# Patient Record
Sex: Female | Born: 1980 | Race: Black or African American | Hispanic: No | Marital: Single | Smoking: Current every day smoker
Health system: Southern US, Community
[De-identification: ages and names within clinical notes are randomized; demographics above are authoritative.]

## PROBLEM LIST (undated history)

## (undated) ENCOUNTER — Inpatient Hospital Stay (HOSPITAL_COMMUNITY): Payer: Self-pay

## (undated) DIAGNOSIS — I10 Essential (primary) hypertension: Secondary | ICD-10-CM

---

## 2002-11-10 ENCOUNTER — Emergency Department (HOSPITAL_COMMUNITY): Admission: EM | Admit: 2002-11-10 | Discharge: 2002-11-10 | Payer: Self-pay | Admitting: Emergency Medicine

## 2004-06-25 ENCOUNTER — Emergency Department: Payer: Self-pay | Admitting: Internal Medicine

## 2004-10-24 ENCOUNTER — Ambulatory Visit (HOSPITAL_COMMUNITY): Admission: RE | Admit: 2004-10-24 | Discharge: 2004-10-24 | Payer: Self-pay

## 2006-03-22 ENCOUNTER — Emergency Department: Payer: Self-pay | Admitting: Emergency Medicine

## 2006-05-21 ENCOUNTER — Emergency Department: Payer: Self-pay | Admitting: Emergency Medicine

## 2006-05-21 ENCOUNTER — Emergency Department (HOSPITAL_COMMUNITY): Admission: EM | Admit: 2006-05-21 | Discharge: 2006-05-21 | Payer: Self-pay | Admitting: Emergency Medicine

## 2006-08-12 ENCOUNTER — Emergency Department: Payer: Self-pay | Admitting: General Practice

## 2006-08-17 ENCOUNTER — Emergency Department: Payer: Self-pay | Admitting: Emergency Medicine

## 2006-09-04 ENCOUNTER — Emergency Department: Payer: Self-pay

## 2007-01-22 ENCOUNTER — Emergency Department: Payer: Self-pay

## 2007-02-06 ENCOUNTER — Emergency Department: Payer: Self-pay | Admitting: Emergency Medicine

## 2009-08-20 ENCOUNTER — Emergency Department: Payer: Self-pay

## 2011-05-01 ENCOUNTER — Emergency Department: Payer: Self-pay | Admitting: Internal Medicine

## 2011-05-01 LAB — CBC WITH DIFFERENTIAL/PLATELET
Basophil %: 0.3 %
Eosinophil #: 0 10*3/uL (ref 0.0–0.7)
HGB: 14.6 g/dL (ref 12.0–16.0)
Lymphocyte #: 1.6 10*3/uL (ref 1.0–3.6)
MCH: 31.3 pg (ref 26.0–34.0)
MCHC: 33.7 g/dL (ref 32.0–36.0)
MCV: 93 fL (ref 80–100)
Monocyte #: 0.3 10*3/uL (ref 0.0–0.7)
Neutrophil #: 4.6 10*3/uL (ref 1.4–6.5)

## 2011-05-01 LAB — URINALYSIS, COMPLETE
Blood: NEGATIVE
Glucose,UR: NEGATIVE mg/dL (ref 0–75)
Nitrite: NEGATIVE
Ph: 9 (ref 4.5–8.0)

## 2011-05-01 LAB — COMPREHENSIVE METABOLIC PANEL
Anion Gap: 12 (ref 7–16)
Bilirubin,Total: 0.4 mg/dL (ref 0.2–1.0)
Calcium, Total: 9.2 mg/dL (ref 8.5–10.1)
Chloride: 105 mmol/L (ref 98–107)
Co2: 25 mmol/L (ref 21–32)
EGFR (Non-African Amer.): >60
Glucose: 99 mg/dL (ref 65–99)
Osmolality: 283 (ref 275–301)
Potassium: 4.3 mmol/L (ref 3.5–5.1)
SGOT(AST): 29 U/L (ref 15–37)
Sodium: 142 mmol/L (ref 136–145)

## 2011-05-01 LAB — PREGNANCY, URINE: Pregnancy Test, Urine: NEGATIVE m[IU]/mL

## 2011-05-01 LAB — LIPASE, BLOOD: Lipase: 73 U/L (ref 73–393)

## 2011-10-18 ENCOUNTER — Emergency Department: Payer: Self-pay | Admitting: Emergency Medicine

## 2012-04-24 ENCOUNTER — Emergency Department: Payer: Self-pay | Admitting: Emergency Medicine

## 2012-04-24 LAB — COMPREHENSIVE METABOLIC PANEL
Albumin: 3.6 g/dL (ref 3.4–5.0)
Anion Gap: 7 (ref 7–16)
BUN: 9 mg/dL (ref 7–18)
Calcium, Total: 8.6 mg/dL (ref 8.5–10.1)
Glucose: 87 mg/dL (ref 65–99)
Osmolality: 277 (ref 275–301)
Potassium: 3.1 mmol/L — ABNORMAL LOW (ref 3.5–5.1)
Sodium: 140 mmol/L (ref 136–145)
Total Protein: 7.7 g/dL (ref 6.4–8.2)

## 2012-11-01 ENCOUNTER — Emergency Department: Payer: Self-pay | Admitting: Emergency Medicine

## 2012-11-01 LAB — URINALYSIS, COMPLETE: Glucose,UR: NEGATIVE mg/dL (ref 0–75)

## 2012-11-01 LAB — CBC
HGB: 12.7 g/dL (ref 12.0–16.0)
MCH: 30.5 pg (ref 26.0–34.0)
Platelet: 295 10*3/uL (ref 150–440)
RBC: 4.17 10*6/uL (ref 3.80–5.20)
RDW: 13.1 % (ref 11.5–14.5)

## 2012-11-07 ENCOUNTER — Emergency Department: Payer: Self-pay | Admitting: Emergency Medicine

## 2012-11-07 LAB — CBC
HCT: 34.8 % — ABNORMAL LOW (ref 35.0–47.0)
MCH: 30.8 pg (ref 26.0–34.0)
MCV: 90 fL (ref 80–100)
RBC: 3.87 10*6/uL (ref 3.80–5.20)
WBC: 9.6 10*3/uL (ref 3.6–11.0)

## 2012-11-08 LAB — URINALYSIS, COMPLETE
Bilirubin,UR: NEGATIVE
Ketone: NEGATIVE
Specific Gravity: 1.027 (ref 1.003–1.030)
Squamous Epithelial: 4
WBC UR: 12 /HPF (ref 0–5)

## 2012-11-08 LAB — HCG, QUANTITATIVE, PREGNANCY: Beta Hcg, Quant.: 35714 m[IU]/mL — ABNORMAL HIGH

## 2013-03-16 ENCOUNTER — Emergency Department: Payer: Self-pay | Admitting: Emergency Medicine

## 2015-06-06 ENCOUNTER — Encounter (HOSPITAL_COMMUNITY): Payer: Self-pay | Admitting: Emergency Medicine

## 2015-06-06 ENCOUNTER — Emergency Department (HOSPITAL_COMMUNITY)
Admission: EM | Admit: 2015-06-06 | Discharge: 2015-06-06 | Disposition: A | Discharge status: Home / Self Care | Payer: Self-pay | Attending: Emergency Medicine | Admitting: Emergency Medicine

## 2015-06-06 DIAGNOSIS — I10 Essential (primary) hypertension: Secondary | ICD-10-CM | POA: Insufficient documentation

## 2015-06-06 DIAGNOSIS — H1133 Conjunctival hemorrhage, bilateral: Secondary | ICD-10-CM | POA: Insufficient documentation

## 2015-06-06 DIAGNOSIS — H109 Unspecified conjunctivitis: Secondary | ICD-10-CM | POA: Insufficient documentation

## 2015-06-06 HISTORY — DX: Essential (primary) hypertension: I10

## 2015-06-06 MED ORDER — TOBRAMYCIN 0.3 % OP SOLN
2.0000 [drp] | OPHTHALMIC | Status: DC
  Administered 2015-06-06: 2 [drp] via OPHTHALMIC
  Filled 2015-06-06: qty 5

## 2015-06-06 NOTE — ED Provider Notes (Signed)
CSN: [id number]     Arrival date & time 06/06/15  4098 History   First MD Initiated Contact with Patient 06/06/15 [phone]     Chief Complaint  Patient presents with  . Eye Problem     HPI Patient ports of bilateral pruritus of her eyes.  She is tried over-the-counter antiallergy medications without improvement in her symptoms.  She woke this morning with significant irritation and redness of her bilateral conjunctiva consistent with conjunctival hemorrhage.  She states she was rubbing her eyes a lot last night.  She does not have a primary care physician.  She denies fevers and chills.  No blurred vision or changes in her vision.  She denies headache.  Does report some nasal congestion over the past week.  Symptoms are mild in severity.   Past Medical History  Diagnosis Date  . Hypertension    History reviewed. No pertinent past surgical history. History reviewed. No pertinent family history. Social History  Substance Use Topics  . Smoking status: None  . Smokeless tobacco: None  . Alcohol Use: None   OB History    No data available     Review of Systems  All other systems reviewed and are negative.     Allergies  Review of patient's allergies indicates no known allergies.  Home Medications  None  BP 165/103 mmHg  Pulse 91  Temp(Src) 98.7 F (37.1 C) (Oral)  Resp 16  SpO2 100% Physical Exam  Constitutional: She is oriented to person, place, and time. She appears well-developed and well-nourished. No distress.  HENT:  Head: Normocephalic and atraumatic.  Eyes: EOM are normal. Pupils are equal, round, and reactive to light. Right eye exhibits chemosis. Right eye exhibits no discharge. Left eye exhibits chemosis. Left eye exhibits no discharge. Right conjunctiva is injected. Right conjunctiva has a hemorrhage. Left conjunctiva is injected. Left conjunctiva has a hemorrhage.  Neck: Normal range of motion.  Cardiovascular: Normal rate.   Pulmonary/Chest: Effort normal.   Musculoskeletal: Normal range of motion.  Neurological: She is alert and oriented to person, place, and time.  Skin: Skin is warm and dry.  Psychiatric: She has a normal mood and affect. Judgment normal.  Nursing note and vitals reviewed.   ED Course  Procedures (including critical care time) Labs Review Labs Reviewed - No data to display  Imaging Review No results found. I have personally reviewed and evaluated these images and lab results as part of my medical decision-making.   EKG Interpretation None      MDM   Final diagnoses:  Bilateral conjunctivitis  Subconjunctival bleed, bilateral    Overall the patient is well-appearing.  She'll be started on antibiotic drops.  Much of her injection appears to be some conjunctival hemorrhage.  If she does not improve she'll need to seek optometry or ophthalmology.  She's been given the number for the ophthalmologist.  She understands to return to the emergency department for new or worsening symptoms.    Azalia Bilis, MD 06/06/15 [phone]

## 2015-06-06 NOTE — ED Notes (Signed)
Awake. Verbally responsive. A/O x4. Resp even and unlabored. No audible adventitious breath sounds noted. ABC's intact.  

## 2015-06-06 NOTE — ED Notes (Addendum)
Recently exposed to pink eye. Over the last week has been trying to use allergy medication to manage symptoms. C/O bilateral irritated sclera, watery, itching eyes. Hypertensive in triage. Hx of hypertension, states she can't afford her BP meds

## 2015-06-06 NOTE — Discharge Instructions (Signed)
Bacterial Conjunctivitis Bacterial conjunctivitis, commonly called pink eye, is an inflammation of the clear membrane that covers the white part of the eye (conjunctiva). The inflammation can also happen on the underside of the eyelids. The blood vessels in the conjunctiva become inflamed, causing the eye to become red or pink. Bacterial conjunctivitis may spread easily from one eye to another and from person to person (contagious).  CAUSES  Bacterial conjunctivitis is caused by bacteria. The bacteria may come from your own skin, your upper respiratory tract, or from someone else with bacterial conjunctivitis. SYMPTOMS  The normally white color of the eye or the underside of the eyelid is usually pink or red. The pink eye is usually associated with irritation, tearing, and some sensitivity to light. Bacterial conjunctivitis is often associated with a thick, yellowish discharge from the eye. The discharge may turn into a crust on the eyelids overnight, which causes your eyelids to stick together. If a discharge is present, there may also be some blurred vision in the affected eye. DIAGNOSIS  Bacterial conjunctivitis is diagnosed by your caregiver through an eye exam and the symptoms that you report. Your caregiver looks for changes in the surface tissues of your eyes, which may point to the specific type of conjunctivitis. A sample of any discharge may be collected on a cotton-tip swab if you have a severe case of conjunctivitis, if your cornea is affected, or if you keep getting repeat infections that do not respond to treatment. The sample will be sent to a lab to see if the inflammation is caused by a bacterial infection and to see if the infection will respond to antibiotic medicines. TREATMENT   Bacterial conjunctivitis is treated with antibiotics. Antibiotic eyedrops are most often used. However, antibiotic ointments are also available. Antibiotics pills are sometimes used. Artificial tears or eye  washes may ease discomfort. HOME CARE INSTRUCTIONS   To ease discomfort, apply a cool, clean washcloth to your eye for 10-20 minutes, 3-4 times a day.  Gently wipe away any drainage from your eye with a warm, wet washcloth or a cotton ball.  Wash your hands often with soap and water. Use paper towels to dry your hands.  Do not share towels or washcloths. This may spread the infection.  Change or wash your pillowcase every day.  You should not use eye makeup until the infection is gone.  Do not operate machinery or drive if your vision is blurred.  Stop using contact lenses. Ask your caregiver how to sterilize or replace your contacts before using them again. This depends on the type of contact lenses that you use.  When applying medicine to the infected eye, do not touch the edge of your eyelid with the eyedrop bottle or ointment tube. SEEK IMMEDIATE MEDICAL CARE IF:   Your infection has not improved within 3 days after beginning treatment.  You had yellow discharge from your eye and it returns.  You have increased eye pain.  Your eye redness is spreading.  Your vision becomes blurred.  You have a fever or persistent symptoms for more than 2-3 days.  You have a fever and your symptoms suddenly get worse.  You have facial pain, redness, or swelling. MAKE SURE YOU:   Understand these instructions.  Will watch your condition.  Will get help right away if you are not doing well or get worse.   This information is not intended to replace advice given to you by your health care provider. Make sure you   discuss any questions you have with your health care provider.   Document Released: 04/16/2005 Document Revised: 05/07/2014 Document Reviewed: 09/17/2011 Elsevier Interactive Patient Education 2016 Elsevier Inc.  Subconjunctival Hemorrhage Subconjunctival hemorrhage is bleeding that happens between the white part of your eye (sclera) and the clear membrane that covers the  outside of your eye (conjunctiva). There are many tiny blood vessels near the surface of your eye. A subconjunctival hemorrhage happens when one or more of these vessels breaks and bleeds, causing a red patch to appear on your eye. This is similar to a bruise. Depending on the amount of bleeding, the red patch may only cover a small area of your eye or it may cover the entire visible part of the sclera. If a lot of blood collects under the conjunctiva, there may also be swelling. Subconjunctival hemorrhages do not affect your vision or cause pain, but your eye may feel irritated if there is swelling. Subconjunctival hemorrhages usually do not require treatment, and they disappear on their own within two weeks. CAUSES This condition may be caused by:  Mild trauma, such as rubbing your eye too hard.  Severe trauma or blunt injuries.  Coughing, sneezing, or vomiting.  Straining, such as when lifting a heavy object.  High blood pressure.  Recent eye surgery.  A history of diabetes.  Certain medicines, especially blood thinners (anticoagulants).  Other conditions, such as eye tumors, bleeding disorders, or blood vessel abnormalities. Subconjunctival hemorrhages can happen without an obvious cause.  SYMPTOMS  Symptoms of this condition include:  A bright red or dark red patch on the white part of the eye.  The red area may spread out to cover a larger area of the eye before it goes away.  The red area may turn brownish-yellow before it goes away.  Swelling.  Mild eye irritation. DIAGNOSIS This condition is diagnosed with a physical exam. If your subconjunctival hemorrhage was caused by trauma, your health care provider may refer you to an eye specialist (ophthalmologist) or another specialist to check for other injuries. You may have other tests, including:  An eye exam.  A blood pressure check.  Blood tests to check for bleeding disorders. If your subconjunctival hemorrhage was  caused by trauma, X-rays or a CT scan may be done to check for other injuries. TREATMENT Usually, no treatment is needed. Your health care provider may recommend eye drops or cold compresses to help with discomfort. HOME CARE INSTRUCTIONS  Take over-the-counter and prescription medicines only as directed by your health care provider.  Use eye drops or cold compresses to help with discomfort as directed by your health care provider.  Avoid activities, things, and environments that may irritate or injure your eye.  Keep all follow-up visits as told by your health care provider. This is important. SEEK MEDICAL CARE IF:  You have pain in your eye.  The bleeding does not go away within 3 weeks.  You keep getting new subconjunctival hemorrhages. SEEK IMMEDIATE MEDICAL CARE IF:  Your vision changes or you have difficulty seeing.  You suddenly develop severe sensitivity to light.  You develop a severe headache, persistent vomiting, confusion, or abnormal tiredness (lethargy).  Your eye seems to bulge or protrude from your eye socket.  You develop unexplained bruises on your body.  You have unexplained bleeding in another area of your body.   This information is not intended to replace advice given to you by your health care provider. Make sure you discuss any questions you  have with your health care provider.   Document Released: 04/16/2005 Document Revised: 01/05/2015 Document Reviewed: 06/23/2014 Elsevier Interactive Patient Education 2016 [employer].    Emergency Department Resource Guide 1) Find a Doctor and Pay Out of Pocket Although you won't have to find out who is covered by your insurance plan, it is a good idea to ask around and get recommendations. You will then need to call the office and see if the doctor you have chosen will accept you as a new patient and what types of options they offer for patients who are self-pay. Some doctors offer discounts or will set up  payment plans for their patients who do not have insurance, but you will need to ask so you aren't surprised when you get to your appointment.  2) Contact Your Local Health Department Not all health departments have doctors that can see patients for sick visits, but many do, so it is worth a call to see if yours does. If you don't know where your local health department is, you can check in your phone book. The CDC also has a tool to help you locate your state's health department, and many state websites also have listings of all of their local health departments.  3) Find a Walk-in Clinic If your illness is not likely to be very severe or complicated, you may want to try a walk in clinic. These are popping up all over the country in pharmacies, drugstores, and shopping centers. They're usually staffed by nurse practitioners or physician assistants that have been trained to treat common illnesses and complaints. They're usually fairly quick and inexpensive. However, if you have serious medical issues or chronic medical problems, these are probably not your best option.  No Primary Care Doctor: - Call Health Connect at  [phone] - they can help you locate a primary care doctor that  accepts your insurance, provides certain services, etc. - Physician Referral Service- [phone]  Chronic Pain Problems: Organization         Address  Phone   Notes  [facility]  [phone] Patients need to be referred by their primary care doctor.   Medication Assistance: Organization         Address  Phone   Notes  [facility] Med[facility] [address] [place] [place], [place] [address] [phone] --Must be a resident of [facility] -- Must have NO insurance coverage whatsoever (no Medicaid/ Medicare, etc.) -- The pt. MUST have a primary care doctor that directs their care regularly and follows them in the community   [facility]  [phone]   [employer]  [phone]    Agencies that provide inexpensive medical care: Organization         Address  Phone   Notes  [facility]  [phone]   [facility]    [phone]   [facility] [address] [place], [place] [address] [phone]   [facility] [address] [place] [place] [phone]   Planned Parenthood    [phone]   [facility]    [phone]   [facility]  [address], [place] Phone:  [phone], Fax:  [phone] Hours of Operation:  9 am - 6 pm, M-F.  Also accepts Medicaid/Medicare and self-pay.  [facility]  [address],  [place] Phone: [phone], Fax: [phone]. Hours of Operation:  8:30 am - 5:30 pm, M-F.  Also accepts Medicaid and self-pay.  [facility] [place] [address], [place] Phone: [phone]   [facility] [address] Natasha Bence [place], [place] [phone], Ext. [phone] Mondays & Thursdays: 7-9 AM.  First 15 patients are seen on a first come, first serve basis.    Medicaid-accepting [facility] Providers:  Organization         Address  Phone   Notes  [facility] [address], [place] [phone] Also accepts self-pay patients.  [facility] [address] Laurell Josephs [place], [place]  [phone]   [facility] [address], [place] [phone]   [facility] [address], [place] [phone]   Renaye Rakers [address], [place]   [phone] Only accepts [place] Access [place] patients after they have their name applied to their card.   Self-Pay (no insurance) in [facility]:  Organization         Address  Phone   Notes  Sickle Cell Patients, [facility] [address] [place], [place]  [phone]   [facility] [address] [place], [place] [phone]   [facility] [place]  [address], [place] [phone]   [facility]/Dr. Osei-Bonsu  [address], [place] or [address], [place] [phone] Phone number for both [place] and [place] locations is the same.  [facility] and [facility] [address] [place] [phone]   [facility] [address], [place] or [address] [phone]   [facility] [address], [place] [phone], phone; [phone], fax Sees patients 1st and 3rd Saturday of every month.  Must not qualify for public or private insurance (i.e. Medicaid, Medicare, [place] Health Choice, Veterans' Benefits)  Household income should be no more than 200% of the poverty level The clinic cannot treat you if you are pregnant or think you are pregnant  Sexually transmitted diseases are not treated at the clinic.    Dental Care: Organization         Address  Phone  Notes  [facility] [address] [place], [place] [phone] Accepts children up to age 90 who are enrolled in [place] or [place] Health Choice; pregnant women with a Medicaid card; and children who have applied for Medicaid or [place] Health Choice, but were declined, whose parents can pay a reduced fee at time of service.  [facility]  [address], [place] [phone] Accepts children up to age 40 who are enrolled in [place] or [place] Health Choice; pregnant women with a Medicaid card; and children who have applied for Medicaid or [place] Health Choice, but were declined, whose parents can pay a reduced fee at time of service.  [facility]  [address] [place], [place] [phone] Patients  are seen by appointment only. Walk-ins are not accepted. [facility] will see patients 30 years of age and older. Monday - Tuesday (8am-5pm) Most Wednesdays (8:30-5pm) $30 per visit, cash only  [employer] [facility] [employer] [facility] PRO[facility]  [address], [facility] [phone] Patients are seen by appointment only. Walk-ins are not accepted. [facility] will see patients 85 years of age and older. One Wednesday Evening (Monthly: Volunteer Based).  $30 per visit, cash only  [employer]  [phone] for adults; Children under age 52, call Graduate Pediatric Dentistry at [phone]. Children aged 65-14, please call [phone] to request a pediatric application.  Dental services are provided in all areas of dental care including fillings, crowns and bridges, complete and partial dentures, implants, gum treatment, root canals, and extractions. Preventive care is also provided. Treatment is provided to both adults and children. Patients are selected via a lottery and there is often a waiting list.   [facility] [address], [place]  [phone] [email or web address]   [facility] [address] [place], [place] [phone], Ext. [phone] Second and Fourth Thursday of each month, opens at 6:30 AM; Clinic ends at 9 AM.  Patients are seen on a first-come first-served basis, and a limited number are seen during each clinic.   [facility]  [address] Ether Griffins [place], [place] [phone]   Eligibility Requirements You must have lived in [place], [place], or [place] counties for at least the last three months.   You cannot be eligible for state or federal sponsored [place], including [employer], [place], or [employer].   You generally cannot be eligible for healthcare insurance through your employer.    How to apply: Eligibility screenings are held every Tuesday and Wednesday afternoon from 1:00 pm until  4:00 pm. You do not need an appointment for the interview!  [facility] [address] [place], [place] [phone]   [facility] Health Department  [phone]   [facility] Health Department  [phone]   [facility] Health Department  [phone]    Behavioral Health Resources in the Community: Intensive Outpatient Programs Organization         Address  Phone  Notes  [facility] Services [address] [place], [place] [phone]   [facility] Outpatient [address], [place], [place] [phone]   [facility] [address], [place], [place]  [phone]   [facility] Mental Health [address]  [place], [place] [phone] or [phone]   Substance Abuse Resources Organization         Address  Phone  Notes  [facility]  [phone]   [facility]  [phone]   [facility]  [phone]   Floydene Flock  [phone]   Residential & Outpatient Substance Abuse Program  [phone]   Psychological Services Organization         Address  Phone  Notes  [facility] Health  [phone]   [facility] Services  [phone]   [facility] Mental Health [address] [place] [phone] or [phone]    Mobile Crisis Teams Organization         Address  Phone  Notes  [facility], Mobile Crisis Care Unit  [phone]   [facility]  [address] [place], [place] [phone]   Doristine Locks [address] [place] [phone]    Self-Help/Support Groups Organization         Address  Phone             Notes  [facility] [place] - variety of  support groups  336- ([phone] Call for more information  Narcotics Anonymous (NA), [facility] [address], [employer] [place]  2 meetings at this location   Residential Sports administrator          Address  Phone  Notes  [facility] [address],    [place]  [phone]   [facility]  [address] [place] [address], [place], [place] [phone]   [facility] [address] [place], [place] [phone] Admissions: 8am-3pm M-F  [facility] [address]    [place], [place] [phone]   [facility] [address] [place], [place], [place] [phone]   [facility] [address]  [place], [place] [phone]   [facility] - Intensive Outpatient [address]ure[address] Josephs [address], [place], [place] [phone]   [facility] ([facility] [address] [place].,  [place], [place] [phone] or [phone]   [facility] ([facility]) [address], [place], [place] [phone] Accepts Medicaid  [facility] [place] [address],  [place] [phone] Substance Abuse/Addiction Treatment   [facility] Organization         Address  Phone  Notes  [facility]  [phone]   Angie Fava, PhD [address] Ervin Kn[place], [place]   [phone] or [phone]   [facility]   [address] [place], [place] [phone]   [facility] [address] [place], [place] [phone] Insurance/Medicaid/sponsorship through [facility] and Families [address]                                    [place], [place] [phone] Therapy/tele-psych/case  [facility] [address], [place] [phone]    Dr. Lolly Mustache  [phone]   [facility]  [employer] [facility] Dept. 1) [address], [place] 2) [address] [place] 3)  [address], [place] [phone]  [phone]   [facility] Child Abuse Hotline 212-776-2929 or [phone] (After Hours)

## 2015-06-18 ENCOUNTER — Emergency Department (HOSPITAL_COMMUNITY)
Admission: EM | Admit: 2015-06-18 | Discharge: 2015-06-18 | Disposition: A | Discharge status: Home / Self Care | Payer: Self-pay | Attending: Emergency Medicine | Admitting: Emergency Medicine

## 2015-06-18 ENCOUNTER — Encounter (HOSPITAL_COMMUNITY): Payer: Self-pay | Admitting: Emergency Medicine

## 2015-06-18 DIAGNOSIS — I10 Essential (primary) hypertension: Secondary | ICD-10-CM | POA: Insufficient documentation

## 2015-06-18 DIAGNOSIS — H61891 Other specified disorders of right external ear: Secondary | ICD-10-CM | POA: Insufficient documentation

## 2015-06-18 DIAGNOSIS — F17200 Nicotine dependence, unspecified, uncomplicated: Secondary | ICD-10-CM | POA: Insufficient documentation

## 2015-06-18 DIAGNOSIS — H6191 Disorder of right external ear, unspecified: Secondary | ICD-10-CM

## 2015-06-18 DIAGNOSIS — B354 Tinea corporis: Secondary | ICD-10-CM | POA: Insufficient documentation

## 2015-06-18 MED ORDER — NYSTATIN-TRIAMCINOLONE 100000-0.1 UNIT/GM-% EX OINT: 1.0000 "application " | TOPICAL_OINTMENT | Freq: Two times a day (BID) | CUTANEOUS | Status: DC

## 2015-06-18 NOTE — ED Notes (Signed)
Pt reports right earache x 1 week, denies fever nor flu like symptoms. Alert and oriented x 4, no obvious distress

## 2015-06-18 NOTE — ED Provider Notes (Signed)
CSN: [id number]     Arrival date & time 06/18/15  1837 History  By signing my name below, I, Emmanuella Mensah, attest that this documentation has been prepared under the direction and in the presence of Karolina Zamor, PA-C . Electronically Signed: Angelene Giovanni, ED Scribe. 06/18/2015. 7:14 PM.  Chief Complaint  Patient presents with  . Otalgia   The history is provided by the patient. No language interpreter was used.   HPI Comments: Debra Obrien is a 35 y.o. female who presents to the Emergency Department complaining of gradually worsening itchy wound around right ear with clear drainage onset 6 days ago. She states that she wears the same headset for approx. 12-16 hours a day at work and her ear sweats a lot throughout the day. She states that she has been using Blue star ointment with soothing aloe on her ear with no relief. She denies wearing any new jewelry or new headsets. No fever, chills, ear pain, or any hearing loss.    Past Medical History  Diagnosis Date  . Hypertension    History reviewed. No pertinent past surgical history. No family history on file. Social History  Substance Use Topics  . Smoking status: Current Every Day Smoker  . Smokeless tobacco: None  . Alcohol Use: None   OB History    No data available     Review of Systems  Constitutional: Negative for fever and chills.  HENT: Positive for ear discharge (discharge from a wound around the ear). Negative for ear pain and hearing loss.   Skin: Positive for wound (around right ear).      Allergies  Review of patient's allergies indicates no known allergies.  Home Medications   Prior to Admission medications   Not on File   BP 169/98 mmHg  Pulse 87  Temp(Src) 98 F (36.7 C) (Oral)  Resp 18  SpO2 95%  LMP 06/18/2015 Physical Exam  Constitutional: She is oriented to person, place, and time. She appears well-developed and well-nourished. No distress.  HENT:  Head: Normocephalic and  atraumatic.  TMs normal bilaterally, normal ear canal bilaterally. See skin exam.  Eyes: Conjunctivae and EOM are normal.  Neck: Neck supple. No tracheal deviation present.  Cardiovascular: Normal rate.   Pulmonary/Chest: Effort normal. No respiratory distress.  Musculoskeletal: Normal range of motion.  Neurological: She is alert and oriented to person, place, and time.  Skin: Skin is warm and dry.  Lesion to the right earlobe that is erythematous, sharply demarcated, with clear drainage and crusting. It is nontender. No cellulitic changes. Duration. It's itchy.  Psychiatric: She has a normal mood and affect. Her behavior is normal.  Nursing note and vitals reviewed.   ED Course  Procedures (including critical care time) DIAGNOSTIC STUDIES: Oxygen Saturation is 95% on RA, adequate by my interpretation.    COORDINATION OF CARE: 7:13 PM- Pt advised of plan for treatment and pt agrees. Pt will receive anti-fungal cream.   MDM   Final diagnoses:  Lesion of earlobe, right  Tinea corporis     patient with right ear lesion, differential includes tinea, allergic reaction, cancerous lesion. Will try on antifungal cream with steroid. I do not think this is cellulitis, lesion is nontender, not warm to the touch. She has not had any injuries to the ear. Patient states that her ear is moist during the day because of the type of ear set that she uses at work. Instructed her to switch to a different air sat. Instructed  to return if not improving.  Filed Vitals:   06/18/15 1843  BP: 169/98  Pulse: 87  Temp: 98 F (36.7 C)  TempSrc: Oral  Resp: 18  SpO2: 95%     Jaynie Crumble, PA-C 06/18/15 1923  Raeford Razor, MD 06/18/15 1927

## 2015-06-18 NOTE — Discharge Instructions (Signed)
Apply ointment twice a day to the earlobe. Keep lesion dry. If not improving or getting worse in nature to follow-up. Use different ear set.    Body Ringworm Ringworm (tinea corporis) is a fungal infection of the skin on the body. This infection is not caused by worms, but is actually caused by a fungus. Fungus normally lives on the top of your skin and can be useful. However, in the case of ringworms, the fungus grows out of control and causes a skin infection. It can involve any area of skin on the body and can spread easily from one person to another (contagious). Ringworm is a common problem for children, but it can affect adults as well. Ringworm is also often found in athletes, especially wrestlers who share equipment and mats.  CAUSES  Ringworm of the body is caused by a fungus called dermatophyte. It can spread by:  Touchingother people who are infected.  Touchinginfected pets.  Touching or sharingobjects that have been in contact with the infected person or pet (hats, combs, towels, clothing, sports equipment). SYMPTOMS   Itchy, raised red spots and bumps on the skin.  Ring-shaped rash.  Redness near the border of the rash with a clear center.  Dry and scaly skin on or around the rash. Not every person develops a ring-shaped rash. Some develop only the red, scaly patches. DIAGNOSIS  Most often, ringworm can be diagnosed by performing a skin exam. Your caregiver may choose to take a skin scraping from the affected area. The sample will be examined under the microscope to see if the fungus is present.  TREATMENT  Body ringworm may be treated with a topical antifungal cream or ointment. Sometimes, an antifungal shampoo that can be used on your body is prescribed. You may be prescribed antifungal medicines to take by mouth if your ringworm is severe, keeps coming back, or lasts a long time.  HOME CARE INSTRUCTIONS   Only take over-the-counter or prescription medicines as directed  by your caregiver.  Wash the infected area and dry it completely before applying yourcream or ointment.  When using antifungal shampoo to treat the ringworm, leave the shampoo on the body for 3-5 minutes before rinsing.   Wear loose clothing to stop clothes from rubbing and irritating the rash.  Wash or change your bed sheets every night while you have the rash.  Have your pet treated by your veterinarian if it has the same infection. To prevent ringworm:   Practice good hygiene.  Wear sandals or shoes in public places and showers.  Do not share personal items with others.  Avoid touching red patches of skin on other people.  Avoid touching pets that have bald spots or wash your hands after doing so. SEEK MEDICAL CARE IF:   Your rash continues to spread after 7 days of treatment.  Your rash is not gone in 4 weeks.  The area around your rash becomes red, warm, tender, and swollen.   This information is not intended to replace advice given to you by your health care provider. Make sure you discuss any questions you have with your health care provider.   Document Released: 04/13/2000 Document Revised: 01/09/2012 Document Reviewed: 10/29/2011 Elsevier Interactive Patient Education [employer]

## 2015-06-19 NOTE — Care Management Note (Signed)
Case Management Note  Patient Details  Name: Debra Obrien MRN: [id number] Date of Birth: [birth year]  Subjective/Objective:     Infection around ear            Action/Plan: NCM contacted pt via phone # [phone]. Pt contacted ED to states she could not afford medication. States [facility] the medication was $84. Provided pt with coupon from [employer] for [facility] for $40.00. Texted to her cell home.   Expected Discharge Date:  06/18/2015              Expected Discharge Plan:  Home/Self Care  In-House Referral:  NA  Discharge planning Services  CM Consult, Medication Assistance  Post Acute Care Choice:  NA Choice offered to:  NA  DME Arranged:  N/A DME Agency:  NA  HH Arranged:  NA HH Agency:  NA  Status of Service:  Completed, signed off  Medicare Important Message Given:    Date Medicare IM Given:    Medicare IM give by:    Date Additional Medicare IM Given:    Additional Medicare Important Message give by:     If discussed at Long Length of Stay Meetings, dates discussed:    Additional Comments:  Elliot Cousin, RN 06/19/2015, 4:51 PM

## 2016-07-13 ENCOUNTER — Emergency Department (HOSPITAL_COMMUNITY)
Admission: EM | Admit: 2016-07-13 | Discharge: 2016-07-13 | Disposition: A | Discharge status: Home / Self Care | Payer: Self-pay | Attending: Physician Assistant | Admitting: Physician Assistant

## 2016-07-13 ENCOUNTER — Encounter (HOSPITAL_COMMUNITY): Payer: Self-pay | Admitting: Neurology

## 2016-07-13 DIAGNOSIS — Z79899 Other long term (current) drug therapy: Secondary | ICD-10-CM | POA: Insufficient documentation

## 2016-07-13 DIAGNOSIS — I10 Essential (primary) hypertension: Secondary | ICD-10-CM | POA: Insufficient documentation

## 2016-07-13 DIAGNOSIS — N939 Abnormal uterine and vaginal bleeding, unspecified: Secondary | ICD-10-CM | POA: Insufficient documentation

## 2016-07-13 DIAGNOSIS — F17200 Nicotine dependence, unspecified, uncomplicated: Secondary | ICD-10-CM | POA: Insufficient documentation

## 2016-07-13 LAB — CBC WITH DIFFERENTIAL/PLATELET
BASOS ABS: 0 10*3/uL (ref 0.0–0.1)
BASOS PCT: 0 %
EOS ABS: 0.4 10*3/uL (ref 0.0–0.7)
EOS PCT: 6 %
HCT: 36.2 % (ref 36.0–46.0)
Hemoglobin: 11.9 g/dL — ABNORMAL LOW (ref 12.0–15.0)
LYMPHS PCT: 32 %
Lymphs Abs: 2.2 10*3/uL (ref 0.7–4.0)
MCH: 29.8 pg (ref 26.0–34.0)
MCHC: 32.9 g/dL (ref 30.0–36.0)
MCV: 90.7 fL (ref 78.0–100.0)
MONO ABS: 0.6 10*3/uL (ref 0.1–1.0)
Monocytes Relative: 9 %
Neutro Abs: 3.6 10*3/uL (ref 1.7–7.7)
Neutrophils Relative %: 53 %
PLATELETS: 377 10*3/uL (ref 150–400)
RBC: 3.99 MIL/uL (ref 3.87–5.11)
RDW: 13.3 % (ref 11.5–15.5)
WBC: 6.9 10*3/uL (ref 4.0–10.5)

## 2016-07-13 LAB — WET PREP, GENITAL
Clue Cells Wet Prep HPF POC: NONE SEEN
Sperm: NONE SEEN
Yeast Wet Prep HPF POC: NONE SEEN

## 2016-07-13 LAB — I-STAT BETA HCG BLOOD, ED (MC, WL, AP ONLY)

## 2016-07-13 MED ORDER — SODIUM CHLORIDE 0.9 % IV BOLUS (SEPSIS)
1000.0000 mL | Freq: Once | INTRAVENOUS | Status: AC
  Administered 2016-07-13: 1000 mL via INTRAVENOUS

## 2016-07-13 MED ORDER — IBUPROFEN 800 MG PO TABS
800.0000 mg | ORAL_TABLET | Freq: Once | ORAL | Status: AC
  Administered 2016-07-13: 800 mg via ORAL
  Filled 2016-07-13: qty 1

## 2016-07-13 MED ORDER — ESTROGENS CONJUGATED 1.25 MG PO TABS: 2.5000 mg | ORAL_TABLET | Freq: Two times a day (BID) | ORAL | 0 refills | Status: DC

## 2016-07-13 MED ORDER — STERILE WATER FOR INJECTION IJ SOLN
INTRAMUSCULAR | Status: AC
  Administered 2016-07-13: 0.9 mL
  Filled 2016-07-13: qty 10

## 2016-07-13 MED ORDER — CEFTRIAXONE SODIUM 250 MG IJ SOLR
250.0000 mg | Freq: Once | INTRAMUSCULAR | Status: AC
  Administered 2016-07-13: 250 mg via INTRAMUSCULAR
  Filled 2016-07-13: qty 250

## 2016-07-13 MED ORDER — METRONIDAZOLE 500 MG PO TABS
2000.0000 mg | ORAL_TABLET | Freq: Once | ORAL | Status: AC
  Administered 2016-07-13: 2000 mg via ORAL
  Filled 2016-07-13: qty 4

## 2016-07-13 MED ORDER — AZITHROMYCIN 250 MG PO TABS
1000.0000 mg | ORAL_TABLET | Freq: Once | ORAL | Status: AC
  Administered 2016-07-13: 1000 mg via ORAL
  Filled 2016-07-13: qty 4

## 2016-07-13 MED ORDER — NORETHINDRONE ACETATE 5 MG PO TABS
5.0000 mg | ORAL_TABLET | Freq: Three times a day (TID) | ORAL | 0 refills | Status: DC
Start: 2016-07-13 — End: 2018-03-08

## 2016-07-13 NOTE — ED Triage Notes (Signed)
Reports vaginal bleeding since Saturday. Her period was due on the 14th and she is very regular. Reports heavy, vaginal bleeding with clots since Saturday. Is not sure how many pads she is using. Abdominal cramping.

## 2016-07-13 NOTE — Discharge Instructions (Signed)
Please read the attached information.  Please use medication as directed.  Please stop smoking immediately as this puts her at high risk for interactions with given medication, potential life-threatening problems could occur.  If bleeding worsens please follow-up immediately for repeat evaluation.  Please follow-up with OB/GYN for repeat analysis in 3 days. If you develop any new or worsening signs or symptoms please return to the emergency room for repeat evaluation and further management.

## 2016-07-13 NOTE — ED Provider Notes (Signed)
[facility]-EMERGENCY DEPT Provider Note   CSN: [id number] Arrival date & time: 07/13/16  1254   History   Chief Complaint Chief Complaint  Patient presents with  . Vaginal Bleeding    HPI Debra Obrien is a 36 y.o. female.   HPI   [age] year old female presents today with complaints of vaginal bleeding.  Patient notes that 6 days ago she had sex with her son's father.  She notes that shortly after she started having bleeding.  She thought that this was her menstrual cycle as it was supposed to start on the 14th several days later.  She notes it started very light and then increased to normal.,  Subsequently becoming heavier than normal.  She notes that she is having clots.  She notes recently she is not having significant active bleeding requiring tampons or pads but continues to have clots frequently.  She notes she is having lower abdominal pain typical of her menstrual cycles, although slightly more severe.  Patient denies any fever chills nausea vomiting, dizziness or lightheadedness.  She reports that she is slightly weak as she has not had any food to eat in the last day.  Patient notes that she does not use birth control, reports that she is a smoker.  She notes she also uses recreational drugs.  Neri changes.  She does report that one of her sexual partners just informed her that she had chlamydia.    Past Medical History:  Diagnosis Date  . Hypertension     There are no active problems to display for this patient.   History reviewed. No pertinent surgical history.  OB History    No data available       Home Medications    Prior to Admission medications   Medication Sig Start Date End Date Taking? Authorizing Provider  norethindrone (AYGESTIN) 5 MG tablet Take 1 tablet (5 mg total) by mouth 3 (three) times daily. 07/13/16   Eyvonne MechanicJeffrey Burtis Imhoff, PA-C  nystatin-triamcinolone ointment (MYCOLOG) Apply 1 application topically 2 (two) times daily. 06/18/15   Jaynie Crumbleatyana Kirichenko, PA-C      Family History No family history on file.  Social History Social History  Substance Use Topics  . Smoking status: Current Every Day Smoker  . Smokeless tobacco: Not on file  . Alcohol use Yes     Allergies   Patient has no known allergies.   Review of Systems Review of Systems  All other systems reviewed and are negative.   Physical Exam Updated Vital Signs BP (!) 164/99   Pulse 78   Temp 99.4 F (37.4 C) (Oral)   Resp 18   LMP 07/07/2016   SpO2 100%   Physical Exam  Constitutional: She is oriented to person, place, and time. She appears well-developed and well-nourished.  HENT:  Head: Normocephalic and atraumatic.  Eyes: Conjunctivae are normal. Pupils are equal, round, and reactive to light. Right eye exhibits no discharge. Left eye exhibits no discharge. No scleral icterus.  Neck: Normal range of motion. No JVD present. No tracheal deviation present.  Pulmonary/Chest: Effort normal. No stridor.  Abdominal: Soft. She exhibits no distension and no mass. There is no tenderness. There is no rebound and no guarding.  Genitourinary:  Genitourinary Comments: Blood clots noted in the vaginal vault.  Cervix is closed with no significant bleeding. No cervical motion TTP   Neurological: She is alert and oriented to person, place, and time. Coordination normal.  Psychiatric: She has a normal mood and affect. Her behavior  is normal. Judgment and thought content normal.  Nursing note and vitals reviewed.    ED Treatments / Results  Labs (all labs ordered are listed, but only abnormal results are displayed) Labs Reviewed  WET PREP, GENITAL - Abnormal; Notable for the following:       Result Value   Trich, Wet Prep PRESENT (*)    WBC, Wet Prep HPF POC MODERATE (*)    All other components within normal limits  CBC WITH DIFFERENTIAL/PLATELET - Abnormal; Notable for the following:    Hemoglobin 11.9 (*)    All other components within normal limits  I-STAT BETA HCG  BLOOD, ED ([facility], [facility], [facility] ONLY)  GC/CHLAMYDIA PROBE AMP ([place]) NOT AT [facility]    EKG  EKG Interpretation None       Radiology No results found.  Procedures Procedures (including critical care time)  Medications Ordered in ED Medications  sodium chloride 0.9 % bolus 1,000 mL (0 mLs Intravenous Stopped 07/13/16 2007)  cefTRIAXone (ROCEPHIN) injection 250 mg (250 mg Intramuscular Given 07/13/16 1810)  azithromycin (ZITHROMAX) tablet 1,000 mg (1,000 mg Oral Given 07/13/16 1809)  ibuprofen (ADVIL,MOTRIN) tablet 800 mg (800 mg Oral Given 07/13/16 1809)  sterile water (preservative free) injection (0.9 mLs  Given 07/13/16 1810)  metroNIDAZOLE (FLAGYL) tablet 2,000 mg (2,000 mg Oral Given 07/13/16 2012)     Initial Impression / Assessment and Plan / ED Course  I have reviewed the triage vital signs and the nursing notes.  Pertinent labs & imaging results that were available during my care of the patient were reviewed by me and considered in my medical decision making (see chart for details).     Final Clinical Impressions(s) / ED Diagnoses   Final diagnoses:  Vaginal bleeding    Labs: Wet prep, beta-hCG, CBC  Imaging:  Consults:  Therapeutics: Ceftriaxone, azithromycin, metronidazole  Discharge Meds:   Assessment/Plan: 36 year old female presents today with vaginal bleeding.  This is her menstrual cycle but is lasting longer in heavier than before.  Patient does not use any tampons or pads, is only having blood clots and passing them frequently.  She has a reassuring hemoglobin, as asymptomatic from the anemia.  Patient currently is smoking, unable to use estrogen on this patient.  Instead she will receive norethindrone prescription.  Patient is instructed to hold this prescription the event the bleeding does not stop in the next several days.  If she continues to have bleeding she is to initiate therapy.  I strongly encourage patient to discontinue smoking cigarettes as taking  this medication with smoking can cause potential cardiovascular problems.  Patient understood the risks of taking medication.  Patient is not having a life-threatening bleed at this time.  She is not pregnant, has no significant abdominal pain.  She will be discharged home after receiving treatment for presumed STD exposure with a positive wet prep showing trichomoniasis.  Patient is following up at the health department for STD management.  Strict return precautions were given.  She verbalized understanding and agreement to today's plan had no further questions or concerns   New Prescriptions Discharge Medication List as of 07/13/2016  6:51 PM    START taking these medications   Details  norethindrone (AYGESTIN) 5 MG tablet Take 1 tablet (5 mg total) by mouth 3 (three) times daily., Starting Fri 07/13/2016, Print         Eyvonne Mechanic, PA-C 07/13/16 2036    Courteney Randall An, MD 07/13/16 2051

## 2016-07-13 NOTE — ED Notes (Signed)
Care handoff to Brooke RN 

## 2016-07-16 LAB — GC/CHLAMYDIA PROBE AMP (~~LOC~~) NOT AT ARMC
Chlamydia: POSITIVE — AB
Neisseria Gonorrhea: NEGATIVE

## 2016-12-04 ENCOUNTER — Encounter (HOSPITAL_COMMUNITY): Payer: Self-pay | Admitting: Family Medicine

## 2016-12-04 DIAGNOSIS — I10 Essential (primary) hypertension: Secondary | ICD-10-CM | POA: Insufficient documentation

## 2016-12-04 DIAGNOSIS — K645 Perianal venous thrombosis: Secondary | ICD-10-CM | POA: Insufficient documentation

## 2016-12-04 NOTE — ED Triage Notes (Addendum)
Patient reports she is experiencing rectal pain that noticed it this morning. Patient denies any anal intercourse or rectal bleeding. Took 800mg  of IBUPROFEN before coming to ED.

## 2016-12-05 ENCOUNTER — Emergency Department (HOSPITAL_COMMUNITY)
Admission: EM | Admit: 2016-12-05 | Discharge: 2016-12-05 | Disposition: A | Discharge status: Home / Self Care | Payer: Self-pay | Attending: Emergency Medicine | Admitting: Emergency Medicine

## 2016-12-05 DIAGNOSIS — K645 Perianal venous thrombosis: Secondary | ICD-10-CM

## 2016-12-05 MED ORDER — HYDROCORTISONE 2.5 % RE CREA
TOPICAL_CREAM | RECTAL | 1 refills | Status: DC
Start: 2016-12-05 — End: 2017-05-30

## 2016-12-05 MED ORDER — HYDROCODONE-ACETAMINOPHEN 5-325 MG PO TABS: 1.0000 | ORAL_TABLET | ORAL | 0 refills | Status: DC | PRN

## 2016-12-05 MED ORDER — DOCUSATE SODIUM 100 MG PO CAPS: 100.0000 mg | ORAL_CAPSULE | Freq: Two times a day (BID) | ORAL | 0 refills | Status: DC

## 2016-12-05 NOTE — ED Provider Notes (Signed)
[facility]-EMERGENCY DEPT Provider Note   CSN: [id number] Arrival date & time: 12/04/16  2151   By signing my name below, I, [name], attest that this documentation has been prepared under the direction and in the presence of Pollina, Canary Brim, MD. Electronically signed, [name], ED Scribe. 12/05/16. 2:22 AM.   History   Chief Complaint Chief Complaint  Patient presents with  . Rectal Pain   The history is provided by the patient and medical records. No language interpreter was used.    Debra Obrien is a 36 y.o. female presenting to the Emergency Department concerning constant rectal pain since waking yesterday. Multiple loose stools noted the day prior. She described 6/10, constant throbbing in triage, and she states her pain has mildly improved on evaluation. Pt with NL appetite. No h/o similar symptoms noted. No h/o hemorrhoids or colitis. No diarrhea, bloody stool, urinary symptoms, abdominal pain, N/V or constipation. No other complaints at this time.   Past Medical History:  Diagnosis Date  . Hypertension     There are no active problems to display for this patient.   History reviewed. No pertinent surgical history.  OB History    No data available       Home Medications    Prior to Admission medications   Medication Sig Start Date End Date Taking? Authorizing Provider  ibuprofen (ADVIL,MOTRIN) 200 MG tablet Take 400 mg by mouth every 6 (six) hours as needed for moderate pain.   Yes [provider]  docusate sodium (COLACE) 100 MG capsule Take 1 capsule (100 mg total) by mouth every 12 (twelve) hours. 12/05/16   Gilda Crease, MD  HYDROcodone-acetaminophen (NORCO/VICODIN) 5-325 MG tablet Take 1-2 tablets by mouth every 4 (four) hours as needed for moderate pain. 12/05/16   Gilda Crease, MD  hydrocortisone (ANUSOL-HC) 2.5 % rectal cream Apply rectally 2 times daily 12/05/16   Gilda Crease, MD  norethindrone (AYGESTIN) 5  MG tablet Take 1 tablet (5 mg total) by mouth 3 (three) times daily. Patient not taking: Reported on 12/05/2016 07/13/16   Eyvonne Mechanic, PA-C  nystatin-triamcinolone ointment [facility]) Apply 1 application topically 2 (two) times daily. Patient not taking: Reported on 12/05/2016 06/18/15   Jaynie Crumble, PA-C    Family History History reviewed. No pertinent family history.  Social History Social History  Substance Use Topics  . Smoking status: Current Every Day Smoker    Packs/day: 1.00    Types: Cigarettes  . Smokeless tobacco: Never Used  . Alcohol use Yes     Comment: 1-2 times a month.      Allergies   Patient has no known allergies.   Review of Systems Review of Systems  Constitutional: Negative for diaphoresis and fever.  Cardiovascular: Negative for chest pain.  Gastrointestinal: Positive for rectal pain. Negative for abdominal pain, anal bleeding, blood in stool, constipation, diarrhea, nausea and vomiting.  Genitourinary: Negative for difficulty urinating, dysuria, frequency, hematuria and urgency.  Musculoskeletal: Negative for back pain.  Skin: Negative for color change and wound.  Allergic/Immunologic: Negative for immunocompromised state.  Neurological: Negative for weakness and numbness.  Hematological: Does not bruise/bleed easily.  All other systems reviewed and are negative.    Physical Exam Updated Vital Signs BP (!) 181/100 (BP Location: Right Arm)   Pulse 77   Temp (!) 97.5 F (36.4 C) (Oral)   Resp 20   Ht 5\' 9"  (1.753 m)   Wt 270 lb (122.5 kg)   SpO2 96%  BMI 39.87 kg/m   Physical Exam  Constitutional: She is oriented to person, place, and time. She appears well-developed and well-nourished. No distress.  HENT:  Head: Normocephalic and atraumatic.  Right Ear: Hearing normal.  Left Ear: Hearing normal.  Nose: Nose normal.  Mouth/Throat: Oropharynx is clear and moist and mucous membranes are normal.  Eyes: Pupils are equal, round,  and reactive to light. Conjunctivae and EOM are normal.  Neck: Normal range of motion. Neck supple.  Cardiovascular: Regular rhythm, S1 normal and S2 normal.  Exam reveals no gallop and no friction rub.   No murmur heard. Pulmonary/Chest: Effort normal and breath sounds normal. No respiratory distress. She exhibits no tenderness.  Abdominal: Soft. Normal appearance and bowel sounds are normal. There is no hepatosplenomegaly. There is no tenderness. There is no rebound, no guarding, no tenderness at McBurney's point and negative Murphy's sign. No hernia.  Genitourinary:  Genitourinary Comments: Chaperone present: 1cm thrombosed hemorrhoid at 9 o'clock  Musculoskeletal: Normal range of motion.  Neurological: She is alert and oriented to person, place, and time. She has normal strength. No cranial nerve deficit or sensory deficit. Coordination normal. GCS eye subscore is 4. GCS verbal subscore is 5. GCS motor subscore is 6.  Skin: Skin is warm, dry and intact. No rash noted. No cyanosis.  Psychiatric: She has a normal mood and affect. Her speech is normal and behavior is normal. Thought content normal.  Nursing note and vitals reviewed.    ED Treatments / Results  DIAGNOSTIC STUDIES: Oxygen Saturation is 96% on RA, NL by my interpretation.    COORDINATION OF CARE: 2:17 AM-Discussed next steps with pt. Pt verbalized understanding and is agreeable with the plan. Pt prepared for rectal exam. Imaging pending.   Labs (all labs ordered are listed, but only abnormal results are displayed) Labs Reviewed - No data to display  EKG  EKG Interpretation None       Radiology No results found.  Procedures Procedures (including critical care time)  Medications Ordered in ED Medications - No data to display   Initial Impression / Assessment and Plan / ED Course  I have reviewed the triage vital signs and the nursing notes.  Pertinent labs & imaging results that were available during my  care of the patient were reviewed by me and considered in my medical decision making (see chart for details).     Patient presents with complaints of rectal pain. She reports the pain is on the external area of the rectum. She has not had any bleeding. Direct visualization reveals an external hemorrhoid that is tense, tender and likely thrombosed. This was discussed with the patient. She declines drainage of thrombosed hemorrhoid, which is for medical management. Was given analgesia, topical corticosteroid and stool softener, follow-up with general surgery. Return if symptoms worsen.  Final Clinical Impressions(s) / ED Diagnoses   Final diagnoses:  Thrombosed external hemorrhoid    New Prescriptions New Prescriptions   DOCUSATE SODIUM (COLACE) 100 MG CAPSULE    Take 1 capsule (100 mg total) by mouth every 12 (twelve) hours.   HYDROCODONE-ACETAMINOPHEN (NORCO/VICODIN) 5-325 MG TABLET    Take 1-2 tablets by mouth every 4 (four) hours as needed for moderate pain.   HYDROCORTISONE (ANUSOL-HC) 2.5 % RECTAL CREAM    Apply rectally 2 times daily  I personally performed the services described in this documentation, which was scribed in my presence. The recorded information has been reviewed and is accurate.    Gilda CreasePollina, Christopher J, MD  12/05/16 0319  

## 2017-05-30 ENCOUNTER — Other Ambulatory Visit: Payer: Self-pay

## 2017-05-30 ENCOUNTER — Emergency Department (HOSPITAL_COMMUNITY): Payer: Self-pay

## 2017-05-30 ENCOUNTER — Emergency Department (HOSPITAL_COMMUNITY)
Admission: EM | Admit: 2017-05-30 | Discharge: 2017-05-30 | Disposition: A | Discharge status: Home / Self Care | Payer: Self-pay | Attending: Emergency Medicine | Admitting: Emergency Medicine

## 2017-05-30 ENCOUNTER — Encounter (HOSPITAL_COMMUNITY): Payer: Self-pay

## 2017-05-30 DIAGNOSIS — F17210 Nicotine dependence, cigarettes, uncomplicated: Secondary | ICD-10-CM | POA: Insufficient documentation

## 2017-05-30 DIAGNOSIS — I10 Essential (primary) hypertension: Secondary | ICD-10-CM | POA: Insufficient documentation

## 2017-05-30 DIAGNOSIS — R519 Headache, unspecified: Secondary | ICD-10-CM

## 2017-05-30 DIAGNOSIS — R51 Headache: Secondary | ICD-10-CM | POA: Insufficient documentation

## 2017-05-30 DIAGNOSIS — E876 Hypokalemia: Secondary | ICD-10-CM | POA: Insufficient documentation

## 2017-05-30 LAB — COMPREHENSIVE METABOLIC PANEL
ALT: 16 U/L (ref 14–54)
AST: 20 U/L (ref 15–41)
Albumin: 4 g/dL (ref 3.5–5.0)
Alkaline Phosphatase: 65 U/L (ref 38–126)
Anion gap: 7 (ref 5–15)
BUN: 15 mg/dL (ref 6–20)
CO2: 25 mmol/L (ref 22–32)
CREATININE: 0.87 mg/dL (ref 0.44–1.00)
Calcium: 8.4 mg/dL — ABNORMAL LOW (ref 8.9–10.3)
Chloride: 101 mmol/L (ref 101–111)
Glucose, Bld: 93 mg/dL (ref 65–99)
Potassium: 3.4 mmol/L — ABNORMAL LOW (ref 3.5–5.1)
Sodium: 133 mmol/L — ABNORMAL LOW (ref 135–145)
TOTAL PROTEIN: 7.5 g/dL (ref 6.5–8.1)
Total Bilirubin: 0.4 mg/dL (ref 0.3–1.2)

## 2017-05-30 LAB — I-STAT BETA HCG BLOOD, ED (MC, WL, AP ONLY): I-stat hCG, quantitative: <5 m[IU]/mL (ref <5)

## 2017-05-30 LAB — CBC
HCT: 41.2 % (ref 36.0–46.0)
Hemoglobin: 14.3 g/dL (ref 12.0–15.0)
MCH: 31 pg (ref 26.0–34.0)
MCHC: 34.7 g/dL (ref 30.0–36.0)
MCV: 89.2 fL (ref 78.0–100.0)
PLATELETS: 319 10*3/uL (ref 150–400)
RBC: 4.62 MIL/uL (ref 3.87–5.11)
RDW: 12.6 % (ref 11.5–15.5)
WBC: 6.5 10*3/uL (ref 4.0–10.5)

## 2017-05-30 LAB — URINALYSIS, ROUTINE W REFLEX MICROSCOPIC
BILIRUBIN URINE: NEGATIVE
Glucose, UA: NEGATIVE mg/dL
Ketones, ur: NEGATIVE mg/dL
Leukocytes, UA: NEGATIVE
NITRITE: NEGATIVE
PH: 6 (ref 5.0–8.0)
Protein, ur: NEGATIVE mg/dL
SPECIFIC GRAVITY, URINE: 1.011 (ref 1.005–1.030)

## 2017-05-30 LAB — LIPASE, BLOOD: Lipase: 23 U/L (ref 11–51)

## 2017-05-30 MED ORDER — POTASSIUM CHLORIDE CRYS ER 20 MEQ PO TBCR
40.0000 meq | EXTENDED_RELEASE_TABLET | Freq: Once | ORAL | Status: AC
  Administered 2017-05-30: 40 meq via ORAL
  Filled 2017-05-30: qty 2

## 2017-05-30 MED ORDER — METOCLOPRAMIDE HCL 5 MG/ML IJ SOLN
10.0000 mg | Freq: Once | INTRAMUSCULAR | Status: AC
  Administered 2017-05-30: 10 mg via INTRAVENOUS
  Filled 2017-05-30: qty 2

## 2017-05-30 MED ORDER — HYDROCHLOROTHIAZIDE 25 MG PO TABS: 25.0000 mg | ORAL_TABLET | Freq: Every day | ORAL | 0 refills | Status: DC

## 2017-05-30 MED ORDER — ACETAMINOPHEN 500 MG PO TABS
1000.0000 mg | ORAL_TABLET | Freq: Once | ORAL | Status: AC
  Administered 2017-05-30: 1000 mg via ORAL
  Filled 2017-05-30: qty 2

## 2017-05-30 NOTE — ED Triage Notes (Signed)
Patient c/o headache and vomiting since this AM. 

## 2017-05-30 NOTE — ED Notes (Signed)
Accidentally clicked off CT head wo contrast

## 2017-05-30 NOTE — ED Provider Notes (Signed)
[facility]-EMERGENCY DEPT Provider Note   CSN: [id number] Arrival date & time: 05/30/17  [id number]     History   Chief Complaint Chief Complaint  Patient presents with  . Headache  . Emesis    HPI Debra Obrien is a 37 y.o. female.  She awakened with frontal headache throbbing in nature onset 3 AM today accompanied by nausea and vomiting 2 or 3 times.  She went to bed last night feeling fine.  No other associated symptoms.  No visual change or lightheadedness.  Nothing makes symptoms better or worse no treatment prior to coming here.  HPI  Past Medical History:  Diagnosis Date  . Hypertension     There are no active problems to display for this patient.   History reviewed. No pertinent surgical history.  OB History    No data available       Home Medications    Prior to Admission medications   Medication Sig Start Date End Date Taking? Authorizing Provider  norethindrone (AYGESTIN) 5 MG tablet Take 1 tablet (5 mg total) by mouth 3 (three) times daily. Patient not taking: Reported on 12/05/2016 07/13/16   Eyvonne MechanicHedges, Jeffrey, PA-C    Family History Family History  Problem Relation Age of Onset  . Diabetes Father     Social History Social History   Tobacco Use  . Smoking status: Current Every Day Smoker    Packs/day: 1.00    Types: Cigarettes  . Smokeless tobacco: Never Used  Substance Use Topics  . Alcohol use: Yes    Comment: 1-2 times a month.   . Drug use: Yes    Types: Marijuana, Cocaine    Comment: Marijuana: Daily. Cocaine: Once every 2 weeks.    Last time used cocaine was 2 days ago.  No history of IV drug use Allergies   Patient has no known allergies.   Review of Systems Review of Systems  Constitutional: Negative.   HENT: Negative.   Respiratory: Negative.   Cardiovascular: Negative.   Gastrointestinal: Positive for nausea and vomiting.  Genitourinary:       Menses irregular  Musculoskeletal: Negative.   Skin:  Negative.   Neurological: Positive for headaches.  Psychiatric/Behavioral: Negative.   All other systems reviewed and are negative.    Physical Exam Updated Vital Signs BP (!) 149/111 (BP Location: Left Arm)   Pulse (!) 106   Temp 98 F (36.7 C) (Oral)   Resp 15   Ht 5\' 9"  (1.753 m)   Wt 136.1 kg (300 lb)   LMP 05/27/2017   SpO2 96%   BMI 44.30 kg/m   Physical Exam  Constitutional: She is oriented to person, place, and time. She appears well-developed and well-nourished.  HENT:  Head: Normocephalic and atraumatic.  Eyes: Conjunctivae are normal. Pupils are equal, round, and reactive to light.  Neck: Neck supple. No tracheal deviation present. No thyromegaly present.  Cardiovascular: Normal rate and regular rhythm.  No murmur heard. Pulmonary/Chest: Effort normal and breath sounds normal.  Abdominal: Soft. Bowel sounds are normal. She exhibits no distension. There is no tenderness.  Obese  Musculoskeletal: Normal range of motion. She exhibits no edema or tenderness.  Neurological: She is alert and oriented to person, place, and time. Coordination normal.  Gait normal Romberg normal pronator drift normal finger to nose normal DTR symmetric bilaterally at knee jerk ankle jerk and biceps toes downward going bilaterally.  Not lightheaded on standing  Skin: Skin is warm and dry. No  rash noted.  Psychiatric: She has a normal mood and affect.  Nursing note and vitals reviewed.    ED Treatments / Results  Labs (all labs ordered are listed, but only abnormal results are displayed) Labs Reviewed  LIPASE, BLOOD  COMPREHENSIVE METABOLIC PANEL  CBC  URINALYSIS, ROUTINE W REFLEX MICROSCOPIC  I-STAT BETA HCG BLOOD, ED ([facility], [facility], [facility] ONLY)  I-STAT BETA HCG BLOOD, ED ([facility], [facility], [facility] ONLY)    EKG  EKG Interpretation None      Results for orders placed or performed during the hospital encounter of 05/30/17  Lipase, blood  Result Value Ref Range   Lipase 23 11 - 51 U/L    Comprehensive metabolic panel  Result Value Ref Range   Sodium 133 (L) 135 - 145 mmol/L   Potassium 3.4 (L) 3.5 - 5.1 mmol/L   Chloride 101 101 - 111 mmol/L   CO2 25 22 - 32 mmol/L   Glucose, Bld 93 65 - 99 mg/dL   BUN 15 6 - 20 mg/dL   Creatinine, Ser 1.61 0.44 - 1.00 mg/dL   Calcium 8.4 (L) 8.9 - 10.3 mg/dL   Total Protein 7.5 6.5 - 8.1 g/dL   Albumin 4.0 3.5 - 5.0 g/dL   AST 20 15 - 41 U/L   ALT 16 14 - 54 U/L   Alkaline Phosphatase 65 38 - 126 U/L   Total Bilirubin 0.4 0.3 - 1.2 mg/dL   GFR calc non Af Amer >60 >60 mL/min   GFR calc Af Amer >60 >60 mL/min   Anion gap 7 5 - 15  CBC  Result Value Ref Range   WBC 6.5 4.0 - 10.5 K/uL   RBC 4.62 3.87 - 5.11 MIL/uL   Hemoglobin 14.3 12.0 - 15.0 g/dL   HCT 09.6 04.5 - 40.9 %   MCV 89.2 78.0 - 100.0 fL   MCH 31.0 26.0 - 34.0 pg   MCHC 34.7 30.0 - 36.0 g/dL   RDW 81.1 91.4 - 78.2 %   Platelets 319 150 - 400 K/uL  Urinalysis, Routine w reflex microscopic  Result Value Ref Range   Color, Urine YELLOW YELLOW   APPearance HAZY (A) CLEAR   Specific Gravity, Urine 1.011 1.005 - 1.030   pH 6.0 5.0 - 8.0   Glucose, UA NEGATIVE NEGATIVE mg/dL   Hgb urine dipstick MODERATE (A) NEGATIVE   Bilirubin Urine NEGATIVE NEGATIVE   Ketones, ur NEGATIVE NEGATIVE mg/dL   Protein, ur NEGATIVE NEGATIVE mg/dL   Nitrite NEGATIVE NEGATIVE   Leukocytes, UA NEGATIVE NEGATIVE   RBC / HPF 0-5 0 - 5 RBC/hpf   WBC, UA 0-5 0 - 5 WBC/hpf   Bacteria, UA RARE (A) NONE SEEN   Squamous Epithelial / LPF 6-30 (A) NONE SEEN   Mucus PRESENT    Hyaline Casts, UA PRESENT    Ct Head Wo Contrast  Result Date: 05/30/2017 CLINICAL DATA:  Headache and vomiting since this morning EXAM: CT HEAD WITHOUT CONTRAST TECHNIQUE: Contiguous axial images were obtained from the base of the skull through the vertex without intravenous contrast. COMPARISON:  None. FINDINGS: Brain: No evidence of acute infarction, hemorrhage, hydrocephalus, extra-axial collection or mass  lesion/mass effect. 5 mm calcification along the right tentorium likely reflecting a small calcified meningioma. Vascular: No hyperdense vessel or unexpected calcification. Skull: No osseous abnormality. Sinuses/Orbits: Visualized paranasal sinuses are clear. Visualized mastoid sinuses are clear. Visualized orbits demonstrate no focal abnormality. Other: None IMPRESSION: No acute intracranial pathology. Electronically Signed   By:  Elige Ko   On: 05/30/2017 12:08    Radiology No results found.  Procedures Procedures (including critical care time)  Medications Ordered in ED Medications  metoCLOPramide (REGLAN) injection 10 mg (not administered)     Initial Impression / Assessment and Plan / ED Course  I have reviewed the triage vital signs and the nursing notes.  Pertinent labs & imaging results that were available during my care of the patient were reviewed by me and considered in my medical decision making (see chart for details).   12:55 PM headache much improved after treatment intravenous Reglan.  Nausea has resolved.  She is requesting Tylenol for headache.  Ordered by me.  Also ordered by me Potassium oral supplementation. Patient alert awake Glasgow Coma Score 15 drink water without nausea or vomiting.  She is been without antihypertensive medication and primary care physician for prolonged period plan prescription HCTZ.  Referral primary care   Final Clinical Impressions(s) / ED Diagnoses  DX#1 bad headache #2 elevated blood pressure #3 hypokalemia Final diagnoses:  None    ED Discharge Orders    None       Doug Sou, MD 05/30/17 [phone]

## 2017-07-22 ENCOUNTER — Emergency Department (HOSPITAL_COMMUNITY)
Admission: EM | Admit: 2017-07-22 | Discharge: 2017-07-22 | Disposition: A | Discharge status: Home / Self Care | Payer: Self-pay | Attending: Emergency Medicine | Admitting: Emergency Medicine

## 2017-07-22 ENCOUNTER — Other Ambulatory Visit: Payer: Self-pay

## 2017-07-22 ENCOUNTER — Encounter (HOSPITAL_COMMUNITY): Payer: Self-pay

## 2017-07-22 DIAGNOSIS — R1030 Lower abdominal pain, unspecified: Secondary | ICD-10-CM | POA: Insufficient documentation

## 2017-07-22 DIAGNOSIS — Z5321 Procedure and treatment not carried out due to patient leaving prior to being seen by health care provider: Secondary | ICD-10-CM | POA: Insufficient documentation

## 2017-07-22 LAB — I-STAT BETA HCG BLOOD, ED (MC, WL, AP ONLY)

## 2017-07-22 NOTE — ED Triage Notes (Signed)
Patient c/o bilateral lower abdominal pain, white vaginal discharge and itching x 2 weeks.

## 2017-07-22 NOTE — ED Notes (Signed)
Patient called for room placement x2 with no answer. 

## 2017-07-22 NOTE — ED Notes (Signed)
Two gold saved in main lab. PT upset due to istat hcg test order

## 2017-07-22 NOTE — ED Notes (Addendum)
Called for room placement x1 with no answer. 

## 2017-07-22 NOTE — ED Notes (Signed)
Bed: [id number] Expected date:  Expected time:  Means of arrival:  Comments: Called EVS to mop the floor

## 2017-07-22 NOTE — ED Notes (Signed)
Patient called for assign room with no answer.

## 2017-09-01 ENCOUNTER — Encounter (HOSPITAL_COMMUNITY): Payer: Self-pay

## 2017-09-01 ENCOUNTER — Other Ambulatory Visit: Payer: Self-pay

## 2017-09-01 ENCOUNTER — Emergency Department (HOSPITAL_COMMUNITY)
Admission: EM | Admit: 2017-09-01 | Discharge: 2017-09-01 | Disposition: A | Discharge status: Home / Self Care | Payer: No Typology Code available for payment source | Attending: Emergency Medicine | Admitting: Emergency Medicine

## 2017-09-01 ENCOUNTER — Emergency Department (HOSPITAL_COMMUNITY): Payer: No Typology Code available for payment source

## 2017-09-01 DIAGNOSIS — I10 Essential (primary) hypertension: Secondary | ICD-10-CM | POA: Insufficient documentation

## 2017-09-01 DIAGNOSIS — F17210 Nicotine dependence, cigarettes, uncomplicated: Secondary | ICD-10-CM | POA: Insufficient documentation

## 2017-09-01 DIAGNOSIS — S39012A Strain of muscle, fascia and tendon of lower back, initial encounter: Secondary | ICD-10-CM | POA: Insufficient documentation

## 2017-09-01 DIAGNOSIS — Y939 Activity, unspecified: Secondary | ICD-10-CM | POA: Insufficient documentation

## 2017-09-01 DIAGNOSIS — Y929 Unspecified place or not applicable: Secondary | ICD-10-CM | POA: Insufficient documentation

## 2017-09-01 DIAGNOSIS — S3992XA Unspecified injury of lower back, initial encounter: Secondary | ICD-10-CM | POA: Diagnosis present

## 2017-09-01 DIAGNOSIS — Y999 Unspecified external cause status: Secondary | ICD-10-CM | POA: Diagnosis not present

## 2017-09-01 LAB — I-STAT BETA HCG BLOOD, ED (MC, WL, AP ONLY)

## 2017-09-01 MED ORDER — HYDROCHLOROTHIAZIDE 25 MG PO TABS: 25.0000 mg | ORAL_TABLET | Freq: Every day | ORAL | 0 refills | Status: DC

## 2017-09-01 MED ORDER — CYCLOBENZAPRINE HCL 10 MG PO TABS
10.0000 mg | ORAL_TABLET | Freq: Once | ORAL | Status: AC
  Administered 2017-09-01: 10 mg via ORAL
  Filled 2017-09-01: qty 1

## 2017-09-01 MED ORDER — NAPROXEN 375 MG PO TABS: 375.0000 mg | ORAL_TABLET | Freq: Two times a day (BID) | ORAL | 0 refills | Status: DC

## 2017-09-01 MED ORDER — CYCLOBENZAPRINE HCL 10 MG PO TABS: 10.0000 mg | ORAL_TABLET | Freq: Two times a day (BID) | ORAL | 0 refills | Status: DC | PRN

## 2017-09-01 MED ORDER — IBUPROFEN 200 MG PO TABS
600.0000 mg | ORAL_TABLET | Freq: Once | ORAL | Status: AC
  Administered 2017-09-01: 600 mg via ORAL
  Filled 2017-09-01: qty 3

## 2017-09-01 NOTE — ED Notes (Signed)
Hope, NA informed of pt's elevated b/p.

## 2017-09-01 NOTE — Discharge Instructions (Signed)
Do not take the muscle relaxer if driving as it will make you sleepy. Return as needed for worsening symptoms.  °
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
## 2018-03-08 ENCOUNTER — Inpatient Hospital Stay (HOSPITAL_COMMUNITY)
Admission: AD | Admit: 2018-03-08 | Discharge: 2018-03-08 | Disposition: A | Discharge status: Home / Self Care | Payer: Self-pay | Source: Ambulatory Visit | Attending: Family Medicine | Admitting: Family Medicine

## 2018-03-08 ENCOUNTER — Encounter (HOSPITAL_COMMUNITY): Payer: Self-pay

## 2018-03-08 ENCOUNTER — Inpatient Hospital Stay (HOSPITAL_COMMUNITY): Payer: Self-pay

## 2018-03-08 DIAGNOSIS — O99331 Smoking (tobacco) complicating pregnancy, first trimester: Secondary | ICD-10-CM | POA: Insufficient documentation

## 2018-03-08 DIAGNOSIS — Z3A01 Less than 8 weeks gestation of pregnancy: Secondary | ICD-10-CM | POA: Insufficient documentation

## 2018-03-08 DIAGNOSIS — O209 Hemorrhage in early pregnancy, unspecified: Secondary | ICD-10-CM | POA: Insufficient documentation

## 2018-03-08 DIAGNOSIS — O3680X0 Pregnancy with inconclusive fetal viability, not applicable or unspecified: Secondary | ICD-10-CM

## 2018-03-08 DIAGNOSIS — F17210 Nicotine dependence, cigarettes, uncomplicated: Secondary | ICD-10-CM | POA: Insufficient documentation

## 2018-03-08 LAB — CBC
HCT: 40 % (ref 36.0–46.0)
Hemoglobin: 13.3 g/dL (ref 12.0–15.0)
MCH: 30.9 pg (ref 26.0–34.0)
MCHC: 33.3 g/dL (ref 30.0–36.0)
MCV: 92.8 fL (ref 80.0–100.0)
Platelets: 340 K/uL (ref 150–400)
RBC: 4.31 MIL/uL (ref 3.87–5.11)
RDW: 12.5 % (ref 11.5–15.5)
WBC: 6.3 K/uL (ref 4.0–10.5)
nRBC: 0 % (ref 0.0–0.2)

## 2018-03-08 LAB — WET PREP, GENITAL
Sperm: NONE SEEN
Trich, Wet Prep: NONE SEEN
Yeast Wet Prep HPF POC: NONE SEEN

## 2018-03-08 LAB — URINALYSIS, ROUTINE W REFLEX MICROSCOPIC

## 2018-03-08 LAB — URINALYSIS, MICROSCOPIC (REFLEX): RBC / HPF: >50 RBC/hpf (ref 0–5)

## 2018-03-08 LAB — POCT PREGNANCY, URINE: PREG TEST UR: POSITIVE — AB

## 2018-03-08 LAB — HCG, QUANTITATIVE, PREGNANCY: hCG, Beta Chain, Quant, S: 978 m[IU]/mL — ABNORMAL HIGH

## 2018-03-08 MED ORDER — HYDROCHLOROTHIAZIDE 25 MG PO TABS: 25.0000 mg | ORAL_TABLET | Freq: Every day | ORAL | 5 refills | Status: DC

## 2018-03-08 MED ORDER — METHYLDOPA 250 MG PO TABS: 500.0000 mg | ORAL_TABLET | Freq: Two times a day (BID) | ORAL | 5 refills | Status: DC

## 2018-03-08 NOTE — Discharge Instructions (Signed)
[place] Resource Guide °(Revised August 2014) ° ° °Chronic Pain Problems:  °• [facility]:  [phone]  °         Patients need to be referred by their primary care doctor/specialist ° °Insufficient Money for Medicine:  °·         United Way: call "211"   °• MAP Program at [facility] [place] 641-8030 or [place] 641-7620 °          ° °No Primary Care Doctor:  °To locate a primary care doctor that accepts your insurance or provides certain services:  °·         [facility] [facility]: 832-8000  °·         Physician Referral Service: 1-800-533-3463 ask for “My [place]” °• If no insurance, you need to see if you qualify for GCCN “orange card”, call to set      up appointment for eligibility/enrollment at [phone] or [phone] or visit [place] [facility] ([address], [place] and [address] -[place]) to meet with a GCCN enrollment specialist. ° °Agencies that provide inexpensive (sliding fee scale) medical care:  ° °•    [facility] - [facility] [place] - [phone] °•    [facility]  -  [facility] - 336-878-6027 °•    [facility] - 336-832-7272 °•    [facility] - 336-832-4444 °•    [facility] - 336-832-3150 °•    [facility] - 336-832-8035 °• [facility] - [facility] [place] - 336-272-     1050 °• [facility] [place] - 336-370-9091 °• [facility]: 336-832-8035  °• [facility]: 336-832-4777  °• [facility]: 336-373-0678  °• [facility] - 336- 387-6161 ° ° ° °Medicaid-accepting [place] Providers:  °·         [facility] - 641-2100 (No Family Planning accepted) °·         [address], 641-2100, Mon-Fri 9am-5pm °·          [facility] - [phone] °• [address], [address], Mon-Thursday 8am-5pm, Fri 8am-noon °• [facility] - [phone] °·         [address], [address], Mon-Fri 7:30am-4:30pm °·         [facility] - 299-7000 °·         [address] [place] [address], Mon-Fri 8am-5pm  °·        [facility] - 373-1557 °·         [address] °·         Only accepts [place] Access Medicaid patients after they have their name applied to their card ° °Self Pay (no insurance) in [place]:  °         Sickle Cell Patients:  °• [address], (336) 832-1970 °[facility]: °• [address], [place] (336) 832-7272 ° °     [facility] °• [address], [place] (336) 832-4444 ° °[facility]: °• [address], (336) 832-8035 °           [facility]  °·         [address], (336) 832-4400 °[facility] °• [address], (336) 832-3150 °          [facility]  °·         [address], [place] [phone] °       [facility] - [address]  °·         641-2100, Mon-Fri 9am-7pm, Sat 9am-1pm °         [facility] [place] °·         [address] [place] [address], [phone] °         [facility] [place]  °·         [address], 878-6027 °[facility] - [facility] [place] °• [address], [place] (336) 884-0224 °         [facility]  °·         [address] [place] [address], [phone] ° [facility]  °• [address], (336) 272-1050 °[facility] °• [address], (336) 370-9091 ° Dr. Osei-Bonsu  °·         [address], [place],  [phone] °         [facility]  °·         [address], [phone] °         [facility]  °·           [address], [phone] °         [facility]  °·         [address], [phone], 1st & 3rd Saturday every month, 10am-1pm ° °OTHERS:  [facility]  ([facility] Only)  (336) [phone] (Thursday only) ° °Strategies for finding a Primary Care Provider:  °1) Find a Doctor and Pay Out of Pocket  °Although you won't have to find out who is covered by your insurance plan, it is a good idea to ask around and get recommendations. You will then need to call the office and see if the doctor you have chosen will accept you as a new patient and what types of options they offer for patients who are self-pay. Some doctors offer discounts or will set up payment plans for their patients who do not have insurance, but you will need to ask so you aren't surprised when you get to your appointment.  °2) Contact [facility] - To see if you qualify for “orange card” access to healthcare safety net providers.  Call for appointment for eligibility/enrollment at [phone] or [phone]- [phone]. (Uninsured, 0-200% FPL, qualifying info)  °Applicants for GCCN are first required to see if they are eligible to enroll in the ACA Marketplace before enrolling in GCCN (and get an exemption if they are not).  °GCCN Criteria for acceptance is:  °? Proof of ACA Marketing exemption - form or documentation  °? Valid photo ID (driver's license, state identification card, passport, home country ID)  °? Proof of [place] residency (e.g. driver’s license, lease/landlord information, pay stubs with address, utility   bill, bank statement, etc.)  °? Proof of income (1040, last year's tax return, W2, 4 current pay stubs, other income proof)  °? Proof of assets (current bank statement + 3 most recent, disability paperwork, life insurance info, tax value on autos, etc.) ° °3)  Contact Your Local Health Department  °Not all health departments have doctors that can see patients for sick visits, but many do, so it is worth a call to see if yours does. If you don't know where your local health department is, you can check in your phone book. The CDC also has a tool to help you locate your state's health department, and many state websites also have listings of all of their local health departments.  °4) Find a Walk-in Clinic  °If your illness is not likely to be very severe or complicated, you may want to try a walk in clinic. These are popping up all over the country in pharmacies, drugstores, and shopping centers. They're usually staffed by nurse practitioners or physician assistants that have been trained to treat common illnesses and complaints. They're usually fairly quick and inexpensive. However, if you have serious medical issues or chronic medical problems, these are probably not your best option  ° °STD Testing:  °·         [facility] [place] Clinic  °·         [address], [place], phone [phone] or [phone]  °·         Monday - Friday, call for an appointment °·         [facility] [place], [facility]D Clinic  °·         [address], [place], phone [phone] or [phone]  °·         Monday - Friday, call for an appointment °Abuse/Neglect:  °·         [place] Child Abuse Hotline: 641-3795  °·         [place] Child Abuse Hotline: 800-378-5315 (After Hours) ° °Emergency Shelter:  °[facility] (336) 271-5959 ° [facility] [place]- (336) 881-5415 ° [facility] [place] - (336) 235-0330 ° [facility] - (336) 375-1332 (ages 11-17) ° Homeless Day Shelter @ [facility] - (336) 332-0824 °  °Mammograms - Free at [facility] - [place] - 614-3233 ° °Maternity Homes:  °·         [facility]: (336) 275-9566   (Homeless mother with  children) °·         [facility]: (704) 372-4663 (Mothers only) °• [facility]: (336) 370-9232 (Pregnant 16-21 years old) °• [facility] -(336) 641-7777 ° °[place] [facility]  °• [facility] °• [address], [place] (336) 355-9701 °·         [facility] [place]  °·         [address], [place]  °·         349-3220 °·         [employer]  °·         [address], [place]  °·         342-7768 °·         [facility]  °·         [address], [place]  °·         342-8140 °·         [facility]  °·         342-8316 °·         [place] [facility] - [facility]  °·         1-888-581-9988 °·         [facility] [place]  °·         [address]  °·         336-349-4454, Insurance °·         [place] Child Abuse Hotline  °·         (336) 342-1394  °·         (336) 342-3537 (After Hours) ° °Behavioral Health Services /Substance Abuse Resources:  °·         [facility]: 882-2125  °·         [facility]: 784-9470  °·        [facility]: (336) 285-9073  °• Narcotics Helpline - 1-866-375-1272 °·         [facility]: (336) 845-3988  °·         Residential & Outpatient Substance Abuse Program - [facility]: 800-659-3381 °• [facility] - (336) 285-2605 °Psychological Services: °·         [facility]: 832-9600  °• [facility]: 1-877-626-1772 °·         [facility]  °·         [address] [place] [address], [place]  °·         ACCESS LINE: 1-800-256-2452     (24 Hour) °• Mobile Crisis:  °• HELPLINES:  °[facility] [place] (336) 370-4264 °[facility] [place] (800) 451-9682 °• Walk In Crisis Services °      [facility] [address] [place] [address] - [place]  (336)676-6905 °        Health - (336)832-9600 or (336) 832-9700 ° [facility] - [address] - [place] (336)899-1505 ° [facility] - [address], [place] (336) 878-6098 ° ° °Dental Assistance:  °If unable to pay or uninsured, contact: [place] [facility] to become qualified for the adult dental clinic. Patient must be enrolled in GCCN (uninsured, 0-200% FPL, qualifying info).  Enroll in GCCN first, then see Primary Care Physician assigned to you, the PCP makes a dental referral. [facility] will receive referral and contacts patient for appointment. ° °Patients with Medicaid  °         [address], 510-2600 ° [facility]Children up to 20 + Pregnant Women) - (336) 641-3152 ° [facility] - [address] - [address] (336) 235-2808 ° °If unable to pay, or uninsured: contact [place] [facility] (641-3152 in [place] - (Children only + Pregnant Women), 641-7733 in [place]- Children only) to become qualified for the adult dental clinic  °Must see if eligible to enroll in ACA Health Insurance Marketplace before enrolling into the GCCN (exemption required) (1-855-733-3711 for an appointment)  www.healthcare.gov;   1-800-318-2596.  If not eligible for ACA, then go by Department of Health and Human Services to see if eligible for “orange card.”    [address], [place] and [address]- [place].  Once you get an orange card, you will have a Primary Care home who will then refer you to dental if needed. ° ° ° ° ° ° ° °Other Low-Cost Community Dental Services:  ° [facility] - 334-4822 (ext 50251) °  [address] [place] [address] ° Dr. Civils - 272-4177 °  [address] °  ° [facility] - 734-7550 °  [address] ° °         [facility]  °         [address], [place], [place], [address]  °         723-1848, Ext. 123  °         2nd and 4th Thursday of the month at 6:30am (Simple  extractions only - no wisdom teeth or surgery) First come/First serve -First 10 clients served ° °         [facility] ([place] residents only) °         [address], [place], [place], [address]  °         723-7904 °          °         [facility]  °         342-8273 °         [facility]  °        703-3100 °        [facility] °         570-6415 °  °Transportation Options: ° Ambulance - 911 - $250-$700 per ride °Family Member to accompany patient (if stable) - [place] [employer] - (336) 355-6499 ° PART - (336) 662-0002 ° Taxi - (336) 272-5112 - [place] ° [facility] - (336) 333-6589 (Application required) ° [place] [facility] - (336) 641-4848 °  ° °

## 2018-03-08 NOTE — MAU Note (Signed)
States she has been bleeding for about a week, started off light and light pink in color but it is getting heavier and darker  Not having any pain today  LMP 01/18/2018

## 2018-03-08 NOTE — MAU Provider Note (Signed)
Chief Complaint: Vaginal Bleeding   First Provider Initiated Contact with Patient 03/08/18 0841      SUBJECTIVE HPI: Debra Obrien is a 37 y.o. [id number] at [redacted]w[redacted]d by LMP who presents to maternity admissions reporting positive pregnancy tests at home x 2, starting 3 weeks ago and onset of bleeding 1 week ago.The bleeding was light but is now dark red and moderate, like a period.  There is no pain or other associated symptoms.  She has not tried any treatments. She reports she has been told she has HTN in the past but is not on any medications. Her BP runs in the 200s over 100s often per the pt.  She denies any chest pain, shortness of breath, or palpitations.  She does not currently have insurance and does not have primary care.   HPI  Past Medical History:  Diagnosis Date  . Hypertension    History reviewed. No pertinent surgical history. Social History   Socioeconomic History  . Marital status: Single    Spouse name: Not on file  . Number of children: Not on file  . Years of education: Not on file  . Highest education level: Not on file  Occupational History  . Not on file  Social Needs  . Financial resource strain: Not on file  . Food insecurity:    Worry: Not on file    Inability: Not on file  . Transportation needs:    Medical: Not on file    Non-medical: Not on file  Tobacco Use  . Smoking status: Current Every Day Smoker    Packs/day: 1.00    Types: Cigarettes  . Smokeless tobacco: Never Used  Substance and Sexual Activity  . Alcohol use: Yes    Comment: 1-2 times a month.   . Drug use: Yes    Types: Marijuana, Cocaine    Comment: Marijuana: Daily. Cocaine: Once every 2 weeks.   Marland Kitchen Sexual activity: Not on file  Lifestyle  . Physical activity:    Days per week: Not on file    Minutes per session: Not on file  . Stress: Not on file  Relationships  . Social connections:    Talks on phone: Not on file    Gets together: Not on file    Attends religious service:  Not on file    Active member of club or organization: Not on file    Attends meetings of clubs or organizations: Not on file    Relationship status: Not on file  . Intimate partner violence:    Fear of current or ex partner: Not on file    Emotionally abused: Not on file    Physically abused: Not on file    Forced sexual activity: Not on file  Other Topics Concern  . Not on file  Social History Narrative  . Not on file   No current facility-administered medications on file prior to encounter.    Current Outpatient Medications on File Prior to Encounter  Medication Sig Dispense Refill  . cyclobenzaprine (FLEXERIL) 10 MG tablet Take 1 tablet (10 mg total) by mouth 2 (two) times daily as needed for muscle spasms. 20 tablet 0  . hydrochlorothiazide (HYDRODIURIL) 25 MG tablet Take 1 tablet (25 mg total) by mouth daily. 30 tablet 0  . naproxen (NAPROSYN) 375 MG tablet Take 1 tablet (375 mg total) by mouth 2 (two) times daily. 15 tablet 0  . norethindrone (AYGESTIN) 5 MG tablet Take 1 tablet (5 mg total) by  mouth 3 (three) times daily. (Patient not taking: Reported on 12/05/2016) 30 tablet 0   No Known Allergies  ROS:  Review of Systems  Constitutional: Negative for chills, fatigue and fever.  Eyes: Negative for visual disturbance.  Respiratory: Negative for shortness of breath.   Cardiovascular: Negative for chest pain.  Gastrointestinal: Negative for abdominal pain, nausea and vomiting.  Genitourinary: Positive for vaginal bleeding. Negative for difficulty urinating, dysuria, flank pain, pelvic pain, vaginal discharge and vaginal pain.  Neurological: Negative for dizziness and headaches.  Psychiatric/Behavioral: Negative.      I have reviewed patient's Past Medical Hx, Surgical Hx, Family Hx, Social Hx, medications and allergies.   Physical Exam   Patient Vitals for the past 24 hrs:  BP Temp Temp src Pulse Resp Weight  03/08/18 0822 (!) 161/98 - - 75 - -  03/08/18 0806 (!)  185/103 98.6 F (37 C) Oral 77 18 -  03/08/18 0746 - - - - - 118.4 kg   Constitutional: Well-developed, well-nourished female in no acute distress.  Cardiovascular: normal rate Respiratory: normal effort GI: Abd soft, non-tender. Pos BS x 4 MS: Extremities nontender, no edema, normal ROM Neurologic: Alert and oriented x 4.  GU: Neg CVAT.  PELVIC EXAM: Cervix pink, visually closed, without lesion, small amount dark red bleeding, vaginal walls and external genitalia normal Bimanual exam: Cervix 0/long/high, firm, anterior, neg CMT, uterus nontender, nonenlarged, adnexa without tenderness, enlargement, or mass   LAB RESULTS Results for orders placed or performed during the hospital encounter of 03/08/18 (from the past 24 hour(s))  Pregnancy, urine POC     Status: Abnormal   Collection Time: 03/08/18  7:53 AM  Result Value Ref Range   Preg Test, Ur POSITIVE (A) NEGATIVE  CBC     Status: None   Collection Time: 03/08/18  8:12 AM  Result Value Ref Range   WBC 6.3 4.0 - 10.5 K/uL   RBC 4.31 3.87 - 5.11 MIL/uL   Hemoglobin 13.3 12.0 - 15.0 g/dL   HCT 16.1 09.6 - 04.5 %   MCV 92.8 80.0 - 100.0 fL   MCH 30.9 26.0 - 34.0 pg   MCHC 33.3 30.0 - 36.0 g/dL   RDW 40.9 81.1 - 91.4 %   Platelets 340 150 - 400 K/uL   nRBC 0.0 0.0 - 0.2 %   Results for orders placed or performed during the hospital encounter of 03/08/18 (from the past 48 hour(s))  Urinalysis, Routine w reflex microscopic     Status: Abnormal   Collection Time: 03/08/18  7:51 AM  Result Value Ref Range   Color, Urine RED (A) YELLOW    Comment: BIOCHEMICALS MAY BE AFFECTED BY COLOR   APPearance TURBID (A) CLEAR   Specific Gravity, Urine  1.005 - 1.030    TEST NOT REPORTED DUE TO COLOR INTERFERENCE OF URINE PIGMENT   pH  5.0 - 8.0    TEST NOT REPORTED DUE TO COLOR INTERFERENCE OF URINE PIGMENT   Glucose, UA (A) NEGATIVE mg/dL    TEST NOT REPORTED DUE TO COLOR INTERFERENCE OF URINE PIGMENT   Hgb urine dipstick (A) NEGATIVE     TEST NOT REPORTED DUE TO COLOR INTERFERENCE OF URINE PIGMENT   Bilirubin Urine (A) NEGATIVE    TEST NOT REPORTED DUE TO COLOR INTERFERENCE OF URINE PIGMENT   Ketones, ur (A) NEGATIVE mg/dL    TEST NOT REPORTED DUE TO COLOR INTERFERENCE OF URINE PIGMENT   Protein, ur (A) NEGATIVE mg/dL    TEST  NOT REPORTED DUE TO COLOR INTERFERENCE OF URINE PIGMENT   Nitrite (A) NEGATIVE    TEST NOT REPORTED DUE TO COLOR INTERFERENCE OF URINE PIGMENT   Leukocytes, UA (A) NEGATIVE    TEST NOT REPORTED DUE TO COLOR INTERFERENCE OF URINE PIGMENT    Comment: Performed at [facility], [address] [place], [place] [address]  Urinalysis, Microscopic (reflex)     Status: Abnormal   Collection Time: 03/08/18  7:51 AM  Result Value Ref Range   RBC / HPF >50 0 - 5 RBC/hpf   WBC, UA 0-5 0 - 5 WBC/hpf   Bacteria, UA RARE (A) NONE SEEN   Squamous Epithelial / LPF 0-5 0 - 5    Comment: Performed at [facility], [address] [place], [place] [address]  Pregnancy, urine POC     Status: Abnormal   Collection Time: 03/08/18  7:53 AM  Result Value Ref Range   Preg Test, Ur POSITIVE (A) NEGATIVE    Comment:        THE SENSITIVITY OF THIS METHODOLOGY IS >24 mIU/mL   CBC     Status: None   Collection Time: 03/08/18  8:12 AM  Result Value Ref Range   WBC 6.3 4.0 - 10.5 K/uL   RBC 4.31 3.87 - 5.11 MIL/uL   Hemoglobin 13.3 12.0 - 15.0 g/dL   HCT 95.2 84.1 - 32.4 %   MCV 92.8 80.0 - 100.0 fL   MCH 30.9 26.0 - 34.0 pg   MCHC 33.3 30.0 - 36.0 g/dL   RDW 40.1 02.7 - 25.3 %   Platelets 340 150 - 400 K/uL   nRBC 0.0 0.0 - 0.2 %    Comment: Performed at [facility], [address], [place], [place] [address]  hCG, quantitative, pregnancy     Status: Abnormal   Collection Time: 03/08/18  8:12 AM  Result Value Ref Range   hCG, Beta Chain, Quant, S 978 (H) <5 mIU/mL    Comment:          GEST. AGE      CONC.  (mIU/mL)   <=1 WEEK        5 - 50     2 WEEKS       50 - 500     3 WEEKS       100 -  10,000     4 WEEKS     1,000 - 30,000     5 WEEKS     3,500 - 115,000   6-8 WEEKS     12,000 - 270,000    12 WEEKS     15,000 - 220,000        FEMALE AND NON-PREGNANT FEMALE:     LESS THAN 5 mIU/mL Performed at [facility], [address], [place], [place] [address]   Wet prep, genital     Status: Abnormal   Collection Time: 03/08/18  8:35 AM  Result Value Ref Range   Yeast Wet Prep HPF POC NONE SEEN NONE SEEN   Trich, Wet Prep NONE SEEN NONE SEEN   Clue Cells Wet Prep HPF POC PRESENT (A) NONE SEEN   WBC, Wet Prep HPF POC FEW (A) NONE SEEN    Comment: MODERATE BACTERIA SEEN   Sperm NONE SEEN     Comment: Performed at [facility], [address] [place], [place] [address]      IMAGING  US Ob Comp Less 14 Wks  Result Date: 03/08/2018 CLINICAL DATA:  Pregnant  patient with vaginal bleeding. EXAM: OBSTETRIC <14 WK [place] AND TRANSVAGINAL OB US TECHNIQUE: Both transabdominal and transvaginal ultrasound examinations were performed for complete evaluation of the gestation as well as the maternal uterus, adnexal regions, and pelvic cul-de-sac. Transvaginal technique was performed to assess early pregnancy. COMPARISON:  None. FINDINGS: Intrauterine gestational sac: None Yolk sac:  Not Visualized. Embryo:  Not Visualized. Cardiac Activity: Not Visualized. Maternal uterus/adnexae: Normal right and left ovaries. Multiple fibroids within the uterus. No free fluid in the pelvis. IMPRESSION: No intrauterine gestation identified. In the setting of positive pregnancy test and no definite intrauterine pregnancy, this reflects a pregnancy of unknown location. Differential considerations include early normal IUP, abnormal IUP, or nonvisualized ectopic pregnancy. Differentiation is achieved with serial beta HCG supplemented by repeat sonography as clinically warranted. Fibroid uterus. Electronically Signed   By: Annia Belt M.D.   On: 03/08/2018 11:37   US Ob Transvaginal  Result Date:  03/08/2018 CLINICAL DATA:  Pregnant patient with vaginal bleeding. EXAM: OBSTETRIC <14 WK [place] AND TRANSVAGINAL OB US TECHNIQUE: Both transabdominal and transvaginal ultrasound examinations were performed for complete evaluation of the gestation as well as the maternal uterus, adnexal regions, and pelvic cul-de-sac. Transvaginal technique was performed to assess early pregnancy. COMPARISON:  None. FINDINGS: Intrauterine gestational sac: None Yolk sac:  Not Visualized. Embryo:  Not Visualized. Cardiac Activity: Not Visualized. Maternal uterus/adnexae: Normal right and left ovaries. Multiple fibroids within the uterus. No free fluid in the pelvis. IMPRESSION: No intrauterine gestation identified. In the setting of positive pregnancy test and no definite intrauterine pregnancy, this reflects a pregnancy of unknown location. Differential considerations include early normal IUP, abnormal IUP, or nonvisualized ectopic pregnancy. Differentiation is achieved with serial beta HCG supplemented by repeat sonography as clinically warranted. Fibroid uterus. Electronically Signed   By: Annia Belt M.D.   On: 03/08/2018 11:37   MAU Management/MDM: Ordered labs and [place] and reviewed results.  Findings today could represent a normal early pregnancy, spontaneous abortion or ectopic pregnancy which can be life-threatening.  Ectopic precautions were given to the patient with plan to return in 48 hours for repeat quant hcg to evaluate pregnancy development.  Pt unable to come during office hours so will return to MAU on 03/10/18 after work for repeat hcg.  Return sooner for emergencies.  Rx written for Aldomet 500 mg BID to start today (to save costs and since pregnancy is desired).   Pt discharged with strict ectopic precautions.  ASSESSMENT  1. Pregnancy of unknown anatomic location   2. Bleeding in early pregnancy     PLAN Discharge home  Allergies as of 03/08/2018   No Known Allergies     Medication List    STOP  taking these medications   cyclobenzaprine 10 MG tablet Commonly known as:  FLEXERIL   hydrochlorothiazide 25 MG tablet Commonly known as:  HYDRODIURIL   naproxen 375 MG tablet Commonly known as:  NAPROSYN   norethindrone 5 MG tablet Commonly known as:  AYGESTIN     TAKE these medications   methyldopa 250 MG tablet Commonly known as:  ALDOMET Take 2 tablets (500 mg total) by mouth 2 (two) times daily.        Sharen Counter Certified Nurse-Midwife 03/08/2018  8:43 AM

## 2018-03-10 ENCOUNTER — Other Ambulatory Visit: Payer: Self-pay

## 2018-03-10 ENCOUNTER — Inpatient Hospital Stay (HOSPITAL_COMMUNITY)
Admission: AD | Admit: 2018-03-10 | Discharge: 2018-03-10 | Disposition: A | Discharge status: Home / Self Care | Payer: Self-pay | Source: Ambulatory Visit | Attending: Obstetrics and Gynecology | Admitting: Obstetrics and Gynecology

## 2018-03-10 DIAGNOSIS — Z3A01 Less than 8 weeks gestation of pregnancy: Secondary | ICD-10-CM | POA: Insufficient documentation

## 2018-03-10 DIAGNOSIS — O039 Complete or unspecified spontaneous abortion without complication: Secondary | ICD-10-CM | POA: Insufficient documentation

## 2018-03-10 LAB — GC/CHLAMYDIA PROBE AMP (~~LOC~~) NOT AT ARMC
CHLAMYDIA, DNA PROBE: NEGATIVE
Neisseria Gonorrhea: NEGATIVE

## 2018-03-10 LAB — HIV ANTIBODY (ROUTINE TESTING W REFLEX): HIV SCREEN 4TH GENERATION: NONREACTIVE

## 2018-03-10 LAB — HCG, QUANTITATIVE, PREGNANCY: HCG, BETA CHAIN, QUANT, S: 301 m[IU]/mL — AB (ref <5)

## 2018-03-10 NOTE — MAU Note (Signed)
Pt refused to have BP rechecked at discharge.

## 2018-03-10 NOTE — MAU Note (Addendum)
Water taken to pt, so that she can take her meds. Stressed to pt how important it is for her to take her BP med

## 2018-03-10 NOTE — Discharge Instructions (Signed)

## 2018-03-10 NOTE — MAU Note (Signed)
Here for follow up hormone level check.  Feeling fine.  Bleeding has lightened up a lot. No pain.

## 2018-03-10 NOTE — MAU Note (Signed)
Pt "just picked up " her BP medication.  Pt to lobby to wait on results.  Instructed her to go ahead and take it now, states " I will when I get home and get some water".  Walked away when said I will get you some.

## 2018-03-10 NOTE — MAU Provider Note (Signed)
History   Chief Complaint:  Follow-up   Debra Obrien is  37 y.o. [id number] Patient's last menstrual period was 01/18/2018.Marland Kitchen Patient is here for follow up of quantitative HCG and ongoing surveillance of pregnancy status.   She is [redacted]w[redacted]d weeks gestation  by LMP.    Since her last visit, the patient is without new complaint. The patient reports bleeding as  none now.  She denies any pain.  She states she just picked up her blood pressure medication and took a dose in the lobby. She denies any chest pain, shortness of breath or headache.   General ROS:  negative  Her previous Quantitative HCG values are: Results for Debra Obrien, Debra Obrien (MRN [id number]) as of 03/10/2018 19:14  Ref. Range 03/08/2018 08:12  HCG, Beta Chain, Quant, S Latest Ref Range: <5 mIU/mL 978 (H)   Physical Exam   Blood pressure (!) 179/106, pulse 86, temperature 97.9 F (36.6 C), temperature source Oral, resp. rate 18, weight 119.4 kg, last menstrual period 01/18/2018, SpO2 100 %.  Focused Gynecological Exam: examination not indicated  Labs: Results for orders placed or performed during the hospital encounter of 03/10/18 (from the past 24 hour(s))  hCG, quantitative, pregnancy   Collection Time: 03/10/18  6:21 PM  Result Value Ref Range   hCG, Beta Chain, Quant, S 301 (H) <5 mIU/mL    Assessment: 1. Miscarriage    Plan: -The patient is instructed to follow up in in 1 week for a repeat HCG and 2 weeks with a provider. -Strongly encouraged patient to take blood pressure medication as prescribed and follow up with a PCP for ongoing management. -Patient may return to MAU if condition changes or worsens.  Rolm Bookbinder CNM 03/10/2018, 7:04 PM

## 2018-03-11 ENCOUNTER — Telehealth: Payer: Self-pay | Admitting: Family Medicine

## 2018-03-11 NOTE — Telephone Encounter (Signed)
Called pt to get her scheduled for her non-stat hcg and SAB f/u w/ provdier. Pt answered verified who she was and said that she was fine she did not need an appointment because she lost the baby. After explaining to her that's why I was calling, her phone started to break-up and could not understand pt. Pt stated that she was at work and that's why her phone was like that. Asked if there was a better time I could call her back to get scheduled and she hung up the phone.

## 2019-01-17 ENCOUNTER — Encounter (HOSPITAL_COMMUNITY): Payer: Self-pay

## 2019-01-19 ENCOUNTER — Emergency Department (HOSPITAL_COMMUNITY): Payer: Medicaid Other

## 2019-01-19 ENCOUNTER — Other Ambulatory Visit: Payer: Self-pay

## 2019-01-19 ENCOUNTER — Encounter (HOSPITAL_COMMUNITY): Payer: Self-pay

## 2019-01-19 ENCOUNTER — Inpatient Hospital Stay (HOSPITAL_COMMUNITY)
Admission: EM | Admit: 2019-01-19 | Discharge: 2019-01-22 | DRG: 759 | Disposition: A | Discharge status: Home / Self Care | Payer: Medicaid Other | Attending: Obstetrics and Gynecology | Admitting: Obstetrics and Gynecology

## 2019-01-19 ENCOUNTER — Other Ambulatory Visit: Payer: Self-pay | Admitting: Emergency Medicine

## 2019-01-19 DIAGNOSIS — Z20828 Contact with and (suspected) exposure to other viral communicable diseases: Secondary | ICD-10-CM | POA: Diagnosis present

## 2019-01-19 DIAGNOSIS — N730 Acute parametritis and pelvic cellulitis: Principal | ICD-10-CM | POA: Diagnosis present

## 2019-01-19 DIAGNOSIS — Z79899 Other long term (current) drug therapy: Secondary | ICD-10-CM

## 2019-01-19 DIAGNOSIS — N739 Female pelvic inflammatory disease, unspecified: Secondary | ICD-10-CM

## 2019-01-19 DIAGNOSIS — Z791 Long term (current) use of non-steroidal anti-inflammatories (NSAID): Secondary | ICD-10-CM

## 2019-01-19 DIAGNOSIS — Z833 Family history of diabetes mellitus: Secondary | ICD-10-CM

## 2019-01-19 DIAGNOSIS — F17210 Nicotine dependence, cigarettes, uncomplicated: Secondary | ICD-10-CM | POA: Diagnosis present

## 2019-01-19 DIAGNOSIS — R1032 Left lower quadrant pain: Secondary | ICD-10-CM

## 2019-01-19 DIAGNOSIS — I10 Essential (primary) hypertension: Secondary | ICD-10-CM | POA: Diagnosis present

## 2019-01-19 LAB — URINALYSIS, ROUTINE W REFLEX MICROSCOPIC
Bilirubin Urine: NEGATIVE
Glucose, UA: NEGATIVE mg/dL
Ketones, ur: NEGATIVE mg/dL
Nitrite: NEGATIVE
Protein, ur: NEGATIVE mg/dL
Specific Gravity, Urine: 1.02 (ref 1.005–1.030)
pH: 6 (ref 5.0–8.0)

## 2019-01-19 LAB — CBC WITH DIFFERENTIAL/PLATELET
Abs Immature Granulocytes: 0.05 10*3/uL (ref 0.00–0.07)
Basophils Absolute: 0 10*3/uL (ref 0.0–0.1)
Basophils Relative: 0 %
Eosinophils Absolute: 0.1 10*3/uL (ref 0.0–0.5)
Eosinophils Relative: 1 %
HCT: 44.7 % (ref 36.0–46.0)
Hemoglobin: 14.8 g/dL (ref 12.0–15.0)
Immature Granulocytes: 0 %
Lymphocytes Relative: 15 %
Lymphs Abs: 2.1 10*3/uL (ref 0.7–4.0)
MCH: 31.6 pg (ref 26.0–34.0)
MCHC: 33.1 g/dL (ref 30.0–36.0)
MCV: 95.3 fL (ref 80.0–100.0)
Monocytes Absolute: 1 10*3/uL (ref 0.1–1.0)
Monocytes Relative: 7 %
Neutro Abs: 10.9 10*3/uL — ABNORMAL HIGH (ref 1.7–7.7)
Neutrophils Relative %: 77 %
Platelets: 316 10*3/uL (ref 150–400)
RBC: 4.69 MIL/uL (ref 3.87–5.11)
RDW: 12.3 % (ref 11.5–15.5)
WBC: 14.3 10*3/uL — ABNORMAL HIGH (ref 4.0–10.5)
nRBC: 0 % (ref 0.0–0.2)

## 2019-01-19 LAB — COMPREHENSIVE METABOLIC PANEL
ALT: 17 U/L (ref 0–44)
AST: 20 U/L (ref 15–41)
Albumin: 3.8 g/dL (ref 3.5–5.0)
Alkaline Phosphatase: 58 U/L (ref 38–126)
Anion gap: 14 (ref 5–15)
BUN: 12 mg/dL (ref 6–20)
CO2: 18 mmol/L — ABNORMAL LOW (ref 22–32)
Calcium: 8.7 mg/dL — ABNORMAL LOW (ref 8.9–10.3)
Chloride: 104 mmol/L (ref 98–111)
Creatinine, Ser: 0.83 mg/dL (ref 0.44–1.00)
GFR calc Af Amer: >60 mL/min (ref >60)
GFR calc non Af Amer: >60 mL/min (ref >60)
Glucose, Bld: 85 mg/dL (ref 70–99)
Potassium: 3.9 mmol/L (ref 3.5–5.1)
Sodium: 136 mmol/L (ref 135–145)
Total Bilirubin: 0.8 mg/dL (ref 0.3–1.2)
Total Protein: 8 g/dL (ref 6.5–8.1)

## 2019-01-19 LAB — HCG, QUANTITATIVE, PREGNANCY: hCG, Beta Chain, Quant, S: 6 m[IU]/mL — ABNORMAL HIGH (ref <5)

## 2019-01-19 LAB — WET PREP, GENITAL
Clue Cells Wet Prep HPF POC: NONE SEEN
Sperm: NONE SEEN
Yeast Wet Prep HPF POC: NONE SEEN

## 2019-01-19 LAB — SARS CORONAVIRUS 2 BY RT PCR (HOSPITAL ORDER, PERFORMED IN ~~LOC~~ HOSPITAL LAB): SARS Coronavirus 2: NEGATIVE

## 2019-01-19 LAB — LIPASE, BLOOD: Lipase: 24 U/L (ref 11–51)

## 2019-01-19 LAB — PREGNANCY, URINE: Preg Test, Ur: NEGATIVE

## 2019-01-19 MED ORDER — IOHEXOL 300 MG/ML  SOLN
100.0000 mL | Freq: Once | INTRAMUSCULAR | Status: AC | PRN
  Administered 2019-01-19: 100 mL via INTRAVENOUS

## 2019-01-19 MED ORDER — HYDROMORPHONE HCL 1 MG/ML IJ SOLN
1.0000 mg | INTRAMUSCULAR | Status: DC | PRN
  Administered 2019-01-20 – 2019-01-22 (×9): 1 mg via INTRAVENOUS
  Filled 2019-01-19 (×9): qty 1

## 2019-01-19 MED ORDER — ONDANSETRON HCL 4 MG/2ML IJ SOLN
4.0000 mg | Freq: Once | INTRAMUSCULAR | Status: AC
  Administered 2019-01-19: 12:00:00 4 mg via INTRAVENOUS
  Filled 2019-01-19: qty 2

## 2019-01-19 MED ORDER — SODIUM CHLORIDE 0.9 % IV SOLN
100.0000 mg | Freq: Two times a day (BID) | INTRAVENOUS | Status: DC
  Administered 2019-01-20: 100 mg via INTRAVENOUS
  Filled 2019-01-19 (×2): qty 100

## 2019-01-19 MED ORDER — HYDROMORPHONE HCL 1 MG/ML IJ SOLN
1.0000 mg | Freq: Once | INTRAMUSCULAR | Status: AC
  Administered 2019-01-19: 1 mg via INTRAVENOUS
  Filled 2019-01-19: qty 1

## 2019-01-19 MED ORDER — SODIUM CHLORIDE 0.9 % IV SOLN
1.0000 g | Freq: Once | INTRAVENOUS | Status: AC
  Administered 2019-01-19: 1 g via INTRAVENOUS
  Filled 2019-01-19: qty 10

## 2019-01-19 MED ORDER — SODIUM CHLORIDE 0.9 % IV BOLUS
1000.0000 mL | Freq: Once | INTRAVENOUS | Status: AC
  Administered 2019-01-19: 09:00:00 1000 mL via INTRAVENOUS

## 2019-01-19 MED ORDER — IBUPROFEN 200 MG PO TABS: 600.0000 mg | ORAL_TABLET | Freq: Four times a day (QID) | ORAL | Status: DC | PRN

## 2019-01-19 MED ORDER — HYDROMORPHONE HCL 1 MG/ML IJ SOLN
1.0000 mg | Freq: Once | INTRAMUSCULAR | Status: AC
  Administered 2019-01-19: 21:00:00 1 mg via INTRAVENOUS
  Filled 2019-01-19: qty 1

## 2019-01-19 MED ORDER — SODIUM CHLORIDE 0.9 % IV SOLN
Freq: Once | INTRAVENOUS | Status: AC
  Administered 2019-01-20: 08:00:00 via INTRAVENOUS

## 2019-01-19 MED ORDER — SODIUM CHLORIDE 0.9 % IV SOLN
100.0000 mg | Freq: Once | INTRAVENOUS | Status: AC
  Administered 2019-01-19: 18:00:00 100 mg via INTRAVENOUS
  Filled 2019-01-19: qty 100

## 2019-01-19 MED ORDER — ONDANSETRON HCL 4 MG/2ML IJ SOLN
4.0000 mg | Freq: Once | INTRAMUSCULAR | Status: AC
  Administered 2019-01-19: 4 mg via INTRAVENOUS
  Filled 2019-01-19: qty 2

## 2019-01-19 MED ORDER — SODIUM CHLORIDE 0.9 % IV SOLN
2.0000 g | Freq: Two times a day (BID) | INTRAVENOUS | Status: DC
  Administered 2019-01-20 (×2): 2 g via INTRAVENOUS
  Filled 2019-01-19 (×3): qty 2

## 2019-01-19 NOTE — ED Provider Notes (Addendum)
[facility] DEPT Provider Note   CSN: [id number] Arrival date & time: 01/19/19  0744     History   Chief Complaint Chief Complaint  Patient presents with   Abdominal Pain    HPI Debra Obrien is a 38 y.o. female.     38 year old female with past medical history of hypertension, brought in by EMS with complaint of generalized abdominal pain x3 days.  Patient states pain is worse in the left lower quadrant, does not radiate, is worse with any movement or deep breathing, pain is constant, progressively worsening and severe described as sharp.  Associated with nausea, one episode of nonbloody nonbilious emesis.  Patient states she was constipated, took 3 Dulcolax yesterday which resulted in a large bowel movement, pain did not improve.  No prior abdominal surgeries.  Denies fevers, chills, chest pain or shortness of breath, no changes in bladder habits, no vaginal discharge.  No other complaints or concerns.     Past Medical History:  Diagnosis Date   Hypertension     Patient Active Problem List   Diagnosis Date Noted   PID (acute pelvic inflammatory disease) 01/19/2019    History reviewed. No pertinent surgical history.   OB History    Gravida  6   Para  3   Term  2   Preterm  1   AB  2   Living  3     SAB      TAB  2   Ectopic      Multiple      Live Births               Home Medications    Prior to Admission medications   Medication Sig Start Date End Date Taking? Authorizing Provider  docusate sodium (COLACE) 100 MG capsule Take 300 mg by mouth 2 (two) times daily as needed for mild constipation.   Yes [provider]  ibuprofen (ADVIL) 200 MG tablet Take 400-800 mg by mouth every 6 (six) hours as needed for fever, headache or moderate pain.   Yes [provider]  methyldopa (ALDOMET) 250 MG tablet Take 2 tablets (500 mg total) by mouth 2 (two) times daily. Patient not taking: Reported on  01/19/2019 03/08/18   Leftwich-Kirby, Kathie Dike, CNM    Family History Family History  Problem Relation Age of Onset   Diabetes Father     Social History Social History   Tobacco Use   Smoking status: Current Every Day Smoker    Packs/day: 1.00    Types: Cigarettes   Smokeless tobacco: Never Used  Substance Use Topics   Alcohol use: Yes    Comment: 1-2 times a month.    Drug use: Yes    Types: Marijuana, Cocaine    Comment: Marijuana: Daily. Cocaine: Once every 2 weeks.      Allergies   Patient has no known allergies.   Review of Systems Review of Systems  Constitutional: Negative for chills and fever.  Respiratory: Negative for shortness of breath.   Cardiovascular: Negative for chest pain.  Gastrointestinal: Positive for abdominal pain, constipation, nausea and vomiting. Negative for diarrhea.  Genitourinary: Negative for dysuria, frequency and urgency.  Musculoskeletal: Negative for back pain.  Skin: Negative for rash and wound.  Allergic/Immunologic: Negative for immunocompromised state.  Neurological: Negative for weakness.  Hematological: Negative for adenopathy.  Psychiatric/Behavioral: Negative for confusion.  All other systems reviewed and are negative.    Physical Exam Updated Vital Signs  BP (!) 166/87 (BP Location: Right Arm)    Pulse 86    Temp 98.9 F (37.2 C) (Oral)    Resp 15    Ht 5\' 9"  (1.753 m)    Wt 99.8 kg    LMP 01/17/2019 (Exact Date)    SpO2 100%    BMI 32.49 kg/m   Physical Exam Vitals signs and nursing note reviewed.  Constitutional:      General: She is not in acute distress.    Appearance: She is well-developed. She is not diaphoretic.     Comments: Tearful, in pain.  HENT:     Head: Normocephalic and atraumatic.  Cardiovascular:     Rate and Rhythm: Normal rate and regular rhythm.     Heart sounds: Normal heart sounds.  Pulmonary:     Effort: Pulmonary effort is normal.     Breath sounds: Normal breath sounds.  Abdominal:       Palpations: Abdomen is soft.     Tenderness: There is generalized abdominal tenderness and tenderness in the left lower quadrant. There is no right CVA tenderness or left CVA tenderness.  Skin:    General: Skin is warm and dry.     Findings: No erythema or rash.  Neurological:     Mental Status: She is alert and oriented to person, place, and time.  Psychiatric:        Behavior: Behavior normal.      ED Treatments / Results  Labs (all labs ordered are listed, but only abnormal results are displayed) Labs Reviewed  WET PREP, GENITAL - Abnormal; Notable for the following components:      Result Value   Trich, Wet Prep PRESENT (*)    WBC, Wet Prep HPF POC MANY (*)    All other components within normal limits  CBC WITH DIFFERENTIAL/PLATELET - Abnormal; Notable for the following components:   WBC 14.3 (*)    Neutro Abs 10.9 (*)    All other components within normal limits  COMPREHENSIVE METABOLIC PANEL - Abnormal; Notable for the following components:   CO2 18 (*)    Calcium 8.7 (*)    All other components within normal limits  URINALYSIS, ROUTINE W REFLEX MICROSCOPIC - Abnormal; Notable for the following components:   APPearance HAZY (*)    Hgb urine dipstick SMALL (*)    Leukocytes,Ua SMALL (*)    Bacteria, UA RARE (*)    All other components within normal limits  HCG, QUANTITATIVE, PREGNANCY - Abnormal; Notable for the following components:   hCG, Beta Chain, Quant, S 6 (*)    All other components within normal limits  CBC WITH DIFFERENTIAL/PLATELET - Abnormal; Notable for the following components:   WBC 10.8 (*)    Neutro Abs 8.3 (*)    All other components within normal limits  SARS CORONAVIRUS 2 (HOSPITAL ORDER, PERFORMED IN [facility])  LIPASE, BLOOD  PREGNANCY, URINE  GC/CHLAMYDIA PROBE AMP ([place]) NOT AT [facility]    EKG None  Radiology US Transvaginal Non-ob  Result Date: 01/19/2019 CLINICAL DATA:  Left lower quadrant abdominal pain.  EXAM: TRANSABDOMINAL AND TRANSVAGINAL ULTRASOUND OF PELVIS TECHNIQUE: Both transabdominal and transvaginal ultrasound examinations of the pelvis were performed. Transabdominal technique was performed for global imaging of the pelvis including uterus, ovaries, adnexal regions, and pelvic cul-de-sac. It was necessary to proceed with endovaginal exam following the transabdominal exam to visualize the endometrium and ovaries. However, patient was in pain and could not complete transvaginal imaging. COMPARISON:  CT scan of same day. FINDINGS: Uterus Measurements: 13.0 x 6.3 x 5.3 cm = volume: 229 mL. No fibroids or other mass visualized. Endometrium Thickness: 5 mm which is within normal limits. No focal abnormality visualized. Right ovary Not visualized as transvaginal exam could not be finished. Left ovary Not visualized as transvaginal exam could not be finished. Other findings No abnormal free fluid. IMPRESSION: Uterus and endometrium unremarkable. Patient could not complete transvaginal imaging due to pain, and the ovaries were not visualized. Electronically Signed   By: Lupita Raider M.D.   On: 01/19/2019 15:06   US Pelvis Complete  Result Date: 01/19/2019 CLINICAL DATA:  Left lower quadrant abdominal pain. EXAM: TRANSABDOMINAL AND TRANSVAGINAL ULTRASOUND OF PELVIS TECHNIQUE: Both transabdominal and transvaginal ultrasound examinations of the pelvis were performed. Transabdominal technique was performed for global imaging of the pelvis including uterus, ovaries, adnexal regions, and pelvic cul-de-sac. It was necessary to proceed with endovaginal exam following the transabdominal exam to visualize the endometrium and ovaries. However, patient was in pain and could not complete transvaginal imaging. COMPARISON:  CT scan of same day. FINDINGS: Uterus Measurements: 13.0 x 6.3 x 5.3 cm = volume: 229 mL. No fibroids or other mass visualized. Endometrium Thickness: 5 mm which is within normal limits. No focal  abnormality visualized. Right ovary Not visualized as transvaginal exam could not be finished. Left ovary Not visualized as transvaginal exam could not be finished. Other findings No abnormal free fluid. IMPRESSION: Uterus and endometrium unremarkable. Patient could not complete transvaginal imaging due to pain, and the ovaries were not visualized. Electronically Signed   By: Lupita Raider M.D.   On: 01/19/2019 15:06   Ct Abdomen Pelvis W Contrast  Result Date: 01/19/2019 CLINICAL DATA:  Acute abdominal pain with rebound tenderness. EXAM: CT ABDOMEN AND PELVIS WITH CONTRAST TECHNIQUE: Multidetector CT imaging of the abdomen and pelvis was performed using the standard protocol following bolus administration of intravenous contrast. CONTRAST:  OMNIPAQUE IOHEXOL 300 MG/ML  SOLN COMPARISON:  None. FINDINGS: Lower chest: Normal. Hepatobiliary: Mild hepatomegaly. Liver parenchyma is otherwise normal. There is a layered double density in the gallbladder which may represent sludge or noncalcified stones. The gallbladder wall is not thickened and the gallbladder is not distended. No biliary ductal dilatation. Pancreas: Unremarkable. No pancreatic ductal dilatation or surrounding inflammatory changes. Spleen: Normal in size without focal abnormality. Adrenals/Urinary Tract: Adrenal glands are unremarkable. Kidneys are normal, without renal calculi, focal lesion, or hydronephrosis. 16 mm low-density lesion next to the right side of the bladder could represent a tiny bladder diverticulum. The bladder is otherwise normal. Stomach/Bowel: Stomach is within normal limits. There is a tiny fecalith in the distal appendix. There is slight soft tissue stranding in the adjacent mesentery but this appears to be more localized toward the adjacent left ovary. No evidence of bowel wall thickening, distention, or inflammatory changes. Vascular/Lymphatic: Aortic atherosclerosis, unusual for a patient of this age. No enlarged  abdominal or pelvic lymph nodes. Reproductive: The uterus and right ovary are normal. Left ovary is not enlarged but there is slight soft tissue stranding around the left ovary the tip of the appendix is immediately adjacent to the left ovary. The left ovary is immediately adjacent to the mid sigmoid colon. Other: No abdominal wall hernia or abnormality. Tiny amount of free fluid in the pelvic cul-de-sac, normal for a female of this age. Musculoskeletal: No acute abnormality. 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 IMPRESSION: 1. Slight soft tissue stranding around  the tip of the appendix with a tiny fecalith in the distal appendix. There is adjacent soft tissue stranding around the left ovary which is adjacent to the tip of the appendix. The tip of the appendix lies in the midline of the pelvis. The adjacent mesenteric inflammation could be coming from the left ovary or less likely from the tip of the appendix. 2. Slight hepatomegaly. 3. Sludge or noncalcified stones in the gallbladder. 4. Aortic atherosclerosis, unusual for a patient of this age. 5. 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 6. 16 mm low-density lesion next to the right side of the bladder which could represent a tiny bladder diverticulum. Aortic Atherosclerosis (ICD10-I70.0). Electronically Signed   By: Francene Boyers M.D.   On: 01/19/2019 13:13    Procedures Procedures (including critical care time)  Medications Ordered in ED Medications  HYDROmorphone (DILAUDID) injection 1 mg (1 mg Intravenous Given 01/20/19 0826)  ibuprofen (ADVIL) tablet 600 mg (has no administration in time range)  cefoTEtan (CEFOTAN) 2 g in sodium chloride 0.9 % 100 mL IVPB (2 g Intravenous Not Given 01/20/19 0901)  doxycycline (VIBRAMYCIN) 100 mg in sodium chloride 0.9 % 250 mL IVPB (100 mg Intravenous New Bag/Given 01/20/19 0640)  HYDROmorphone (DILAUDID) injection 1 mg (1 mg Intravenous Given 01/19/19 0847)  ondansetron (ZOFRAN)  injection 4 mg (4 mg Intravenous Given 01/19/19 0847)  sodium chloride 0.9 % bolus 1,000 mL (0 mLs Intravenous Stopped 01/19/19 1210)  HYDROmorphone (DILAUDID) injection 1 mg (1 mg Intravenous Given 01/19/19 1228)  ondansetron (ZOFRAN) injection 4 mg (4 mg Intravenous Given 01/19/19 1228)  cefTRIAXone (ROCEPHIN) 1 g in sodium chloride 0.9 % 100 mL IVPB (0 g Intravenous Stopped 01/19/19 1325)  iohexol (OMNIPAQUE) 300 MG/ML solution 100 mL (100 mLs Intravenous Contrast Given 01/19/19 1244)  HYDROmorphone (DILAUDID) injection 1 mg (1 mg Intravenous Given 01/19/19 1528)  doxycycline (VIBRAMYCIN) 100 mg in sodium chloride 0.9 % 250 mL IVPB (0 mg Intravenous Stopped 01/19/19 2125)  HYDROmorphone (DILAUDID) injection 1 mg (1 mg Intravenous Given 01/19/19 2054)  0.9 %  sodium chloride infusion ( Intravenous New Bag/Given 01/20/19 0824)     Initial Impression / Assessment and Plan / ED Course  I have reviewed the triage vital signs and the nursing notes.  Pertinent labs & imaging results that were available during my care of the patient were reviewed by me and considered in my medical decision making (see chart for details).  Clinical Course as of Jan 19 941  Mon Jan 19, 2019  1517 38yo female with abdominal pain for the past three days, worse in the LLQ, no relief with taking stool softeners yesterday (did produce a bowel movement). On exam, generalized tenderness, more so on the left lower abdominal region.  Labs with leukocytosis of 14.3, CMP without significant changes, UA with leukocytes, RBC, WBC, rare bacteria. Lipase WNL. Patient was given Rocephin IV.  CT with question of appendicitis vs inflammation around the left ovary.  Case discussed with Dorethea Clan with general surgery who has reviewed the CT along with Dr. Maisie Fus, recommend pelvic work up for PID. US pelvis incomplete as patient could not tolerate exam due to pain despite two doses of dilaudid. Pelvic exam shows yellow/brown dc, diffuse  pelvic tenderness, exam not well tolerated. Additional pain medication was ordered. Case discussed with Dr. Renaye Rakers, ER attending, recommends consult to GYN for admission.   [LM]  1523 Patient was given IV Rocephin and Doxycycline for PID.   [LM]  1532 Case discussed  with Dr. Nettie ElmMichael Ervin, OBGYN who [facility].   [LM]  1555 I discussion with the patient at the bedside and we explained the situation to her, including a [facility], so she can be seen by an OB/GYN doctor and continue IV antibiotics.  She is upset for her long stay in the hospital but she is understanding of the condition.  She is not opposed to further care.  We will arrange transportation.   [MT]  2325 Patient has now been in the emergency department for 15 hours.  I was informed by nursing staff that there were no available beds at [facility] and that the patient will not be able to be transferred tonight.  She appears stable and does not demonstrate signs of Sirs or sepsis.  Will order repeat blood work for the morning as well as continued antibiotics for the morning, in the hopes that she will be transferred in the early morning.  Patient signed out to Dr. Christen BamePolida the overnight ED attending [facility]   [MT]    Clinical Course User Index [LM] Jeannie FendMurphy, Kenady Doxtater A, PA-C [MT] Renaye Rakersrifan, Kermit BaloMatthew J, MD      Final Clinical Impressions(s) / ED Diagnoses   Final diagnoses:  Pelvic inflammatory disease  Left lower quadrant abdominal pain    ED Discharge Orders    None       Jeannie FendMurphy, Malayna Noori A, PA-C 01/19/19 1537    Jeannie FendMurphy, Ree Alcalde A, PA-C 01/20/19 [id number]    Terald Sleeperrifan, Matthew J, MD 01/21/19 1041

## 2019-01-19 NOTE — ED Triage Notes (Signed)
EMS reports from home, c/o lower left abdominal pain x 3 days. Pt thought possible constipation, took stool softener yesterday and had BM with some relief, but states pain returned. Pain in both quadrants with rebound tenderness upon palpation.  BP 162/88 HR 100 RR 20 Sp02 99 RA CBG 106

## 2019-01-20 ENCOUNTER — Other Ambulatory Visit: Payer: Self-pay | Admitting: Obstetrics and Gynecology

## 2019-01-20 DIAGNOSIS — Z833 Family history of diabetes mellitus: Secondary | ICD-10-CM | POA: Diagnosis not present

## 2019-01-20 DIAGNOSIS — R109 Unspecified abdominal pain: Secondary | ICD-10-CM | POA: Diagnosis present

## 2019-01-20 DIAGNOSIS — Z20828 Contact with and (suspected) exposure to other viral communicable diseases: Secondary | ICD-10-CM | POA: Diagnosis present

## 2019-01-20 DIAGNOSIS — I10 Essential (primary) hypertension: Secondary | ICD-10-CM | POA: Diagnosis present

## 2019-01-20 DIAGNOSIS — N730 Acute parametritis and pelvic cellulitis: Principal | ICD-10-CM

## 2019-01-20 DIAGNOSIS — F17210 Nicotine dependence, cigarettes, uncomplicated: Secondary | ICD-10-CM | POA: Diagnosis present

## 2019-01-20 DIAGNOSIS — Z791 Long term (current) use of non-steroidal anti-inflammatories (NSAID): Secondary | ICD-10-CM | POA: Diagnosis not present

## 2019-01-20 DIAGNOSIS — Z79899 Other long term (current) drug therapy: Secondary | ICD-10-CM | POA: Diagnosis not present

## 2019-01-20 LAB — CBC WITH DIFFERENTIAL/PLATELET
Abs Immature Granulocytes: 0.04 10*3/uL (ref 0.00–0.07)
Basophils Absolute: 0 10*3/uL (ref 0.0–0.1)
Basophils Relative: 0 %
Eosinophils Absolute: 0.1 10*3/uL (ref 0.0–0.5)
Eosinophils Relative: 1 %
HCT: 38.4 % (ref 36.0–46.0)
Hemoglobin: 12.7 g/dL (ref 12.0–15.0)
Immature Granulocytes: 0 %
Lymphocytes Relative: 14 %
Lymphs Abs: 1.5 10*3/uL (ref 0.7–4.0)
MCH: 31.2 pg (ref 26.0–34.0)
MCHC: 33.1 g/dL (ref 30.0–36.0)
MCV: 94.3 fL (ref 80.0–100.0)
Monocytes Absolute: 0.8 10*3/uL (ref 0.1–1.0)
Monocytes Relative: 7 %
Neutro Abs: 8.3 10*3/uL — ABNORMAL HIGH (ref 1.7–7.7)
Neutrophils Relative %: 78 %
Platelets: 280 10*3/uL (ref 150–400)
RBC: 4.07 MIL/uL (ref 3.87–5.11)
RDW: 12 % (ref 11.5–15.5)
WBC: 10.8 10*3/uL — ABNORMAL HIGH (ref 4.0–10.5)
nRBC: 0 % (ref 0.0–0.2)

## 2019-01-20 MED ORDER — ALUM & MAG HYDROXIDE-SIMETH 200-200-20 MG/5ML PO SUSP
30.0000 mL | ORAL | Status: DC | PRN
  Administered 2019-01-20: 21:00:00 30 mL via ORAL
  Filled 2019-01-20 (×2): qty 30

## 2019-01-20 MED ORDER — ONDANSETRON HCL 4 MG/2ML IJ SOLN
4.0000 mg | Freq: Four times a day (QID) | INTRAMUSCULAR | Status: DC | PRN
  Administered 2019-01-20 – 2019-01-22 (×5): 4 mg via INTRAVENOUS
  Filled 2019-01-20 (×4): qty 2

## 2019-01-20 MED ORDER — OXYCODONE-ACETAMINOPHEN 5-325 MG PO TABS
1.0000 | ORAL_TABLET | ORAL | Status: DC | PRN
  Administered 2019-01-20 – 2019-01-21 (×3): 2 via ORAL
  Administered 2019-01-21: 1 via ORAL
  Administered 2019-01-21 (×2): 2 via ORAL
  Filled 2019-01-20: qty 2
  Filled 2019-01-20: qty 1
  Filled 2019-01-20 (×4): qty 2

## 2019-01-20 MED ORDER — ZOLPIDEM TARTRATE 5 MG PO TABS: 5.0000 mg | ORAL_TABLET | Freq: Every evening | ORAL | Status: DC | PRN

## 2019-01-20 MED ORDER — IBUPROFEN 800 MG PO TABS: 800.0000 mg | ORAL_TABLET | Freq: Three times a day (TID) | ORAL | Status: DC | PRN

## 2019-01-20 MED ORDER — LACTATED RINGERS IV SOLN
INTRAVENOUS | Status: DC
  Administered 2019-01-20 – 2019-01-21 (×4): via INTRAVENOUS

## 2019-01-20 MED ORDER — SODIUM CHLORIDE 0.9 % IV SOLN: INTRAVENOUS | Status: DC | PRN

## 2019-01-20 MED ORDER — LISINOPRIL 10 MG PO TABS
10.0000 mg | ORAL_TABLET | Freq: Every day | ORAL | Status: DC
  Administered 2019-01-20: 10 mg via ORAL
  Filled 2019-01-20 (×2): qty 1

## 2019-01-20 MED ORDER — GENTAMICIN SULFATE 40 MG/ML IJ SOLN
7.0000 mg/kg | INTRAVENOUS | Status: DC
  Administered 2019-01-20 – 2019-01-21 (×2): 560 mg via INTRAVENOUS
  Filled 2019-01-20 (×3): qty 14

## 2019-01-20 MED ORDER — CLINDAMYCIN PHOSPHATE 900 MG/50ML IV SOLN
900.0000 mg | Freq: Three times a day (TID) | INTRAVENOUS | Status: DC
  Administered 2019-01-20 – 2019-01-22 (×6): 900 mg via INTRAVENOUS
  Filled 2019-01-20 (×8): qty 50

## 2019-01-20 MED ORDER — ONDANSETRON HCL 4 MG PO TABS: 4.0000 mg | ORAL_TABLET | Freq: Four times a day (QID) | ORAL | Status: DC | PRN

## 2019-01-20 MED ORDER — MORPHINE SULFATE (PF) 2 MG/ML IV SOLN
1.0000 mg | INTRAVENOUS | Status: DC | PRN
  Administered 2019-01-22: 2 mg via INTRAVENOUS
  Filled 2019-01-20: qty 1

## 2019-01-20 NOTE — TOC Initial Note (Addendum)
Transition of Care [facility]) - Initial/Assessment Note    Patient Details  Name: Debra Obrien MRN: [id number] Date of Birth: March 04, 1981  [facility] CM/SW Contact:    Erenest Rasher, RN Phone Number: [phone]  01/20/2019, 12:41 PM  Clinical Narrative:                 Spoke to pt and states she was scheduled to start a new job this week. She will need a note at dc. Currently without insurance or PCP. Has hx of HTN. TOC CM will arrange follow up with PCP to establish care. Pt will need MATCH, explained $3 copay and once per year use. Unit TOC CM will continue to follow for dc needs.    Appointment scheduled with [facility] for 02/09/2019 at 9:50 am to establish with PCP for HTN.     Expected Discharge Plan: Home/Self Care Barriers to Discharge: Continued Medical Work up   Patient Goals and CMS Choice        Expected Discharge Plan and Services Expected Discharge Plan: Home/Self Care In-house Referral: PCP / [facility] Discharge Planning Services: CM Consult, [facility]   Living arrangements for the past 2 months: Apartment Expected Discharge Date: (unknown)                                    Prior Living Arrangements/Services Living arrangements for the past 2 months: Apartment Lives with:: Minor Children Patient language and need for interpreter reviewed:: No Do you feel safe going back to the place where you live?: Yes      Need for Family Participation in Patient Care: No (Comment) Care giver support system in place?: No (comment)   Criminal Activity/Legal Involvement Pertinent to Current Situation/Hospitalization: No - Comment as needed  Activities of Daily Living Home Assistive Devices/Equipment: None ADL Screening (condition at time of admission) Patient's cognitive ability adequate to safely complete daily activities?: Yes Is the patient deaf or have difficulty hearing?: No Does the patient have difficulty seeing, even  when wearing glasses/contacts?: No Does the patient have difficulty concentrating, remembering, or making decisions?: No Patient able to express need for assistance with ADLs?: Yes Does the patient have difficulty dressing or bathing?: No Independently performs ADLs?: Yes (appropriate for developmental age) Does the patient have difficulty walking or climbing stairs?: No Weakness of Legs: None Weakness of Arms/Hands: None  Permission Sought/Granted Permission sought to share information with : Case Manager, PCP Permission granted to share information with : Yes, Verbal Permission Granted              Emotional Assessment Appearance:: Appears stated age   Affect (typically observed): Agitated Orientation: : Oriented to Self, Oriented to Place, Oriented to  Time, Oriented to Situation   Psych Involvement: No (comment)  Admission diagnosis:  abdominal pain Patient Active Problem List   Diagnosis Date Noted  . PID (acute pelvic inflammatory disease) 01/19/2019   PCP:  Patient, No Pcp Per Pharmacy:   [facility] [place], [address] [place] [address] Phone: [phone] Fax: [phone]     Social Determinants of Health (SDOH) Interventions    Readmission Risk Interventions No flowsheet data found.

## 2019-01-20 NOTE — H&P (Signed)
Debra Obrien is an 38 y.o. female [phone] who presented to [facility] ER yesterday with abd/pelvic pain. Pain started Sat after her cycle completed. Tried some Motrin which helped but pain increase on Monday. Some nausea but felt related to her pain. No bowel or bladder dysfunction. No fever or chills.  W/U in the ER possible PID.  Monthly cycles. Sexual active without contraception  EAB x 1 SAB x 2 TSVD x 3  H/O HTN but unable to afford meds  H/O STD's in the past  Last pap 5-6 yrs ago. H/O abnormal pap with repeats normal  H/O drug abuse    Menstrual History: Menarche age: 56 Patient's last menstrual period was 01/17/2019 (exact date).    Past Medical History:  Diagnosis Date  . Hypertension     History reviewed. No pertinent surgical history.  Family History  Problem Relation Age of Onset  . Diabetes Father     Social History:  reports that she has been smoking cigarettes. She has been smoking about 1.00 pack per day. She has never used smokeless tobacco. She reports current alcohol use. She reports current drug use. Drugs: Marijuana and Cocaine.  Allergies: No Known Allergies  Medications Prior to Admission  Medication Sig Dispense Refill Last Dose  . docusate sodium (COLACE) 100 MG capsule Take 300 mg by mouth 2 (two) times daily as needed for mild constipation.   01/18/2019 at Unknown time  . ibuprofen (ADVIL) 200 MG tablet Take 400-800 mg by mouth every 6 (six) hours as needed for fever, headache or moderate pain.   01/18/2019 at Unknown time  . methyldopa (ALDOMET) 250 MG tablet Take 2 tablets (500 mg total) by mouth 2 (two) times daily. (Patient not taking: Reported on 01/19/2019) 120 tablet 5 Not Taking at Unknown time    Review of Systems  Constitutional: Negative.   Respiratory: Negative.   Cardiovascular: Negative.   Gastrointestinal: Positive for abdominal pain.  Genitourinary: Negative.     Blood pressure (!) 181/102, pulse 85, temperature 99 F (37.2  C), temperature source Oral, resp. rate 19, height 5\' 9"  (1.753 m), weight 99.8 kg, last menstrual period 01/17/2019, SpO2 99 %, unknown if currently breastfeeding. Physical Exam  Constitutional: She appears well-developed and well-nourished.  Neck: Normal range of motion. Neck supple.  Cardiovascular: Normal rate and regular rhythm.  Respiratory: Effort normal and breath sounds normal.  GI: Soft. Bowel sounds are normal.  Diffuse lower quadrant tenderness, no rebound or guarding  Genitourinary:    Genitourinary Comments: See ER exam     Results for orders placed or performed during the hospital encounter of 01/19/19 (from the past 24 hour(s))  CBC with Differential     Status: Abnormal   Collection Time: 01/20/19  8:16 AM  Result Value Ref Range   WBC 10.8 (H) 4.0 - 10.5 K/uL   RBC 4.07 3.87 - 5.11 MIL/uL   Hemoglobin 12.7 12.0 - 15.0 g/dL   HCT 38.4 36.0 - 46.0 %   MCV 94.3 80.0 - 100.0 fL   MCH 31.2 26.0 - 34.0 pg   MCHC 33.1 30.0 - 36.0 g/dL   RDW 12.0 11.5 - 15.5 %   Platelets 280 150 - 400 K/uL   nRBC 0.0 0.0 - 0.2 %   Neutrophils Relative % 78 %   Neutro Abs 8.3 (H) 1.7 - 7.7 K/uL   Lymphocytes Relative 14 %   Lymphs Abs 1.5 0.7 - 4.0 K/uL   Monocytes Relative 7 %   Monocytes  Absolute 0.8 0.1 - 1.0 K/uL   Eosinophils Relative 1 %   Eosinophils Absolute 0.1 0.0 - 0.5 K/uL   Basophils Relative 0 %   Basophils Absolute 0.0 0.0 - 0.1 K/uL   Immature Granulocytes 0 %   Abs Immature Granulocytes 0.04 0.00 - 0.07 K/uL    US Transvaginal Non-ob  Result Date: 01/19/2019 CLINICAL DATA:  Left lower quadrant abdominal pain. EXAM: TRANSABDOMINAL AND TRANSVAGINAL ULTRASOUND OF PELVIS TECHNIQUE: Both transabdominal and transvaginal ultrasound examinations of the pelvis were performed. Transabdominal technique was performed for global imaging of the pelvis including uterus, ovaries, adnexal regions, and pelvic cul-de-sac. It was necessary to proceed with endovaginal exam following  the transabdominal exam to visualize the endometrium and ovaries. However, patient was in pain and could not complete transvaginal imaging. COMPARISON:  CT scan of same day. FINDINGS: Uterus Measurements: 13.0 x 6.3 x 5.3 cm = volume: 229 mL. No fibroids or other mass visualized. Endometrium Thickness: 5 mm which is within normal limits. No focal abnormality visualized. Right ovary Not visualized as transvaginal exam could not be finished. Left ovary Not visualized as transvaginal exam could not be finished. Other findings No abnormal free fluid. IMPRESSION: Uterus and endometrium unremarkable. Patient could not complete transvaginal imaging due to pain, and the ovaries were not visualized. Electronically Signed   By: Lupita Raider M.D.   On: 01/19/2019 15:06   US Pelvis Complete  Result Date: 01/19/2019 CLINICAL DATA:  Left lower quadrant abdominal pain. EXAM: TRANSABDOMINAL AND TRANSVAGINAL ULTRASOUND OF PELVIS TECHNIQUE: Both transabdominal and transvaginal ultrasound examinations of the pelvis were performed. Transabdominal technique was performed for global imaging of the pelvis including uterus, ovaries, adnexal regions, and pelvic cul-de-sac. It was necessary to proceed with endovaginal exam following the transabdominal exam to visualize the endometrium and ovaries. However, patient was in pain and could not complete transvaginal imaging. COMPARISON:  CT scan of same day. FINDINGS: Uterus Measurements: 13.0 x 6.3 x 5.3 cm = volume: 229 mL. No fibroids or other mass visualized. Endometrium Thickness: 5 mm which is within normal limits. No focal abnormality visualized. Right ovary Not visualized as transvaginal exam could not be finished. Left ovary Not visualized as transvaginal exam could not be finished. Other findings No abnormal free fluid. IMPRESSION: Uterus and endometrium unremarkable. Patient could not complete transvaginal imaging due to pain, and the ovaries were not visualized. Electronically  Signed   By: Lupita Raider M.D.   On: 01/19/2019 15:06   Ct Abdomen Pelvis W Contrast  Result Date: 01/19/2019 CLINICAL DATA:  Acute abdominal pain with rebound tenderness. EXAM: CT ABDOMEN AND PELVIS WITH CONTRAST TECHNIQUE: Multidetector CT imaging of the abdomen and pelvis was performed using the standard protocol following bolus administration of intravenous contrast. CONTRAST:  OMNIPAQUE IOHEXOL 300 MG/ML  SOLN COMPARISON:  None. FINDINGS: Lower chest: Normal. Hepatobiliary: Mild hepatomegaly. Liver parenchyma is otherwise normal. There is a layered double density in the gallbladder which may represent sludge or noncalcified stones. The gallbladder wall is not thickened and the gallbladder is not distended. No biliary ductal dilatation. Pancreas: Unremarkable. No pancreatic ductal dilatation or surrounding inflammatory changes. Spleen: Normal in size without focal abnormality. Adrenals/Urinary Tract: Adrenal glands are unremarkable. Kidneys are normal, without renal calculi, focal lesion, or hydronephrosis. 16 mm low-density lesion next to the right side of the bladder could represent a tiny bladder diverticulum. The bladder is otherwise normal. Stomach/Bowel: Stomach is within normal limits. There is a tiny fecalith in the distal appendix.  There is slight soft tissue stranding in the adjacent mesentery but this appears to be more localized toward the adjacent left ovary. No evidence of bowel wall thickening, distention, or inflammatory changes. Vascular/Lymphatic: Aortic atherosclerosis, unusual for a patient of this age. No enlarged abdominal or pelvic lymph nodes. Reproductive: The uterus and right ovary are normal. Left ovary is not enlarged but there is slight soft tissue stranding around the left ovary the tip of the appendix is immediately adjacent to the left ovary. The left ovary is immediately adjacent to the mid sigmoid colon. Other: No abdominal wall hernia or abnormality. Tiny amount of  free fluid in the pelvic cul-de-sac, normal for a female of this age. Musculoskeletal: No acute abnormality. 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 IMPRESSION: 1. Slight soft tissue stranding around the tip of the appendix with a tiny fecalith in the distal appendix. There is adjacent soft tissue stranding around the left ovary which is adjacent to the tip of the appendix. The tip of the appendix lies in the midline of the pelvis. The adjacent mesenteric inflammation could be coming from the left ovary or less likely from the tip of the appendix. 2. Slight hepatomegaly. 3. Sludge or noncalcified stones in the gallbladder. 4. Aortic atherosclerosis, unusual for a patient of this age. 5. 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 6. 16 mm low-density lesion next to the right side of the bladder which could represent a tiny bladder diverticulum. Aortic Atherosclerosis (ICD10-I70.0). Electronically Signed   By: Francene Boyers M.D.   On: 01/19/2019 13:13    Assessment/Plan: PID HTN Trich + BHCG ( negative UPT)  Pt will be admitted for IV antibiotics. Pt informed of + trich. Will repeat BHCG.  Will consult social worker for discharge planning. Hopefully discharge home after 48 hrs of IV antibiotics. POC reviewed with pt. Pt verbalized understanding.   Hermina Staggers 01/20/2019, 4:25 PM

## 2019-01-20 NOTE — Progress Notes (Signed)
Received call from [facility] ED regarding patient who is to admitted to [facility] for IV antibiotics for PID, asked to speak with patient as she is upset.   I spoke with patient, reviewed case and plan, she is very upset that she has been sitting in ED for almost 24 hours and not allowed to eat. I changed diet order to regular diet and reviewed plan for admission, she states she will not be transferred over if she has to wait much longer.    Feliz Beam, M.D. Attending Center for [employer] farm manager)

## 2019-01-20 NOTE — Progress Notes (Signed)
Dr Rip Harbour arrived on unit and is aware of patient's HTN. Donne Hazel, RN

## 2019-01-20 NOTE — Progress Notes (Signed)
Patient hypertensive. Call made to Dr Marjory Lies office. Spoke with Renelda Loma who "will put a page out to the on call provider". Awaiting call back. Donne Hazel, RN

## 2019-01-20 NOTE — Progress Notes (Signed)
Pharmacy Antibiotic Note  Debra Obrien is a 38 y.o. female admitted on 01/19/2019 with PID.  Pharmacy has been consulted for gentamicin dosing. Pt has orders for cefotetan/doxy from night shift EDP (Dr. Langston Masker) and orders for gent/clinda from Dr. Rip Harbour - day shift EDP.  Dr. Rip Harbour wants gent/clinda.   Plan: Gentamicin 7 mg/kg ABW - gent 560 mg IV q24 hrs - extended interval Check 10 hr gent level on 3rd dose of gent Clinda 900 mg IV q8 per MD F/u renal fxn, WBC, temp, culture data  Height: 5\' 9"  (175.3 cm) Weight: 220 lb (99.8 kg) IBW/kg (Calculated) : 66.2  No data recorded.  Recent Labs  Lab 01/19/19 0808 01/20/19 0816  WBC 14.3* 10.8*  CREATININE 0.83  --     Estimated Creatinine Clearance: 114.4 mL/min (by C-G formula based on SCr of 0.83 mg/dL).    No Known Allergies  Antimicrobials this admission: 9/21 CTX 1 gm IV x 1 dose 9/21 Doxy x 2 doses 9/22 cefotetan x 2 doses 9/22 gent>> 9/22 clinda>>  Dose adjustments this admission:  Microbiology results: 9/21 wet prep: + trich 9/21: GC/chlamydia: sent  Thanks you for allowing pharmacy to be a part of this patient's care.  Eudelia Bunch, Pharm.D [phone] 01/20/2019 2:43 PM

## 2019-01-20 NOTE — Progress Notes (Addendum)
Patient remains hypertensive with MEWS score of yellow. MD aware and saw patient this evening; started patient on lisinopril by mouth. MEWS vital signs sheet placed on door. Will continue to monitor, Donne Hazel, RN

## 2019-01-20 NOTE — ED Notes (Signed)
Call put into OB with request to speak with pt.

## 2019-01-20 NOTE — ED Provider Notes (Signed)
Clinical Course as of Jan 20 935  Mon Jan 19, 2019  1517 38yo female with abdominal pain for the past three days, worse in the LLQ, no relief with taking stool softeners yesterday (did produce a bowel movement). On exam, generalized tenderness, more so on the left lower abdominal region.  Labs with leukocytosis of 14.3, CMP without significant changes, UA with leukocytes, RBC, WBC, rare bacteria. Lipase WNL. Patient was given Rocephin IV.  CT with question of appendicitis vs inflammation around the left ovary.  Case discussed with Maylon Peppers with general surgery who has reviewed the CT along with Dr. Marcello Moores, recommend pelvic work up for PID. US pelvis incomplete as patient could not tolerate exam due to pain despite two doses of dilaudid. Pelvic exam shows yellow/brown dc, diffuse pelvic tenderness, exam not well tolerated. Additional pain medication was ordered. Case discussed with Dr. Langston Masker, ER attending, recommends consult to GYN for admission.   [LM]  1523 Patient was given IV Rocephin and Doxycycline for PID.   [LM]  1532 Case discussed with Dr. Arlina Robes, OBGYN who will admit to [facility].   [LM]  1555 I discussion with the patient at the bedside and we explained the situation to her, including a transfer to [facility], so she can be seen by an OB/GYN doctor and continue IV antibiotics.  She is upset for her long stay in the hospital but she is understanding of the condition.  She is not opposed to further care.  We will arrange transportation.   [MT]  2325 Patient has now been in the emergency department for 15 hours.  I was informed by nursing staff that there were no available beds at [facility] and that the patient will not be able to be transferred tonight.  She appears stable and does not demonstrate signs of Sirs or sepsis.  Will order repeat blood work for the morning as well as continued antibiotics for the morning, in the hopes that she will be transferred in the early morning.   Patient signed out to Dr. Alain Marion the overnight ED attending here at [facility]   [MT]    Clinical Course User Index [LM] Tacy Learn, PA-C [MT] Langston Masker Carola Rhine, MD     Wyvonnia Dusky, MD 01/20/19 [phone]

## 2019-01-21 LAB — CBC WITH DIFFERENTIAL/PLATELET
Abs Immature Granulocytes: 0.04 10*3/uL (ref 0.00–0.07)
Basophils Absolute: 0 10*3/uL (ref 0.0–0.1)
Basophils Relative: 0 %
Eosinophils Absolute: 0.2 10*3/uL (ref 0.0–0.5)
Eosinophils Relative: 2 %
HCT: 38.3 % (ref 36.0–46.0)
Hemoglobin: 12.4 g/dL (ref 12.0–15.0)
Immature Granulocytes: 0 %
Lymphocytes Relative: 22 %
Lymphs Abs: 2 10*3/uL (ref 0.7–4.0)
MCH: 30.9 pg (ref 26.0–34.0)
MCHC: 32.4 g/dL (ref 30.0–36.0)
MCV: 95.5 fL (ref 80.0–100.0)
Monocytes Absolute: 0.8 10*3/uL (ref 0.1–1.0)
Monocytes Relative: 9 %
Neutro Abs: 6.1 10*3/uL (ref 1.7–7.7)
Neutrophils Relative %: 67 %
Platelets: 288 10*3/uL (ref 150–400)
RBC: 4.01 MIL/uL (ref 3.87–5.11)
RDW: 12 % (ref 11.5–15.5)
WBC: 9.1 10*3/uL (ref 4.0–10.5)
nRBC: 0 % (ref 0.0–0.2)

## 2019-01-21 LAB — BASIC METABOLIC PANEL
Anion gap: 10 (ref 5–15)
BUN: 9 mg/dL (ref 6–20)
CO2: 26 mmol/L (ref 22–32)
Calcium: 8.2 mg/dL — ABNORMAL LOW (ref 8.9–10.3)
Chloride: 101 mmol/L (ref 98–111)
Creatinine, Ser: 0.85 mg/dL (ref 0.44–1.00)
GFR calc Af Amer: >60 mL/min (ref >60)
GFR calc non Af Amer: >60 mL/min (ref >60)
Glucose, Bld: 116 mg/dL — ABNORMAL HIGH (ref 70–99)
Potassium: 3.1 mmol/L — ABNORMAL LOW (ref 3.5–5.1)
Sodium: 137 mmol/L (ref 135–145)

## 2019-01-21 LAB — HCG, QUANTITATIVE, PREGNANCY: hCG, Beta Chain, Quant, S: <1 m[IU]/mL (ref <5)

## 2019-01-21 LAB — CERVICOVAGINAL ANCILLARY ONLY
Chlamydia: POSITIVE — AB
Neisseria Gonorrhea: POSITIVE — AB

## 2019-01-21 MED ORDER — POTASSIUM CHLORIDE CRYS ER 10 MEQ PO TBCR
10.0000 meq | EXTENDED_RELEASE_TABLET | Freq: Two times a day (BID) | ORAL | Status: DC
  Administered 2019-01-21 – 2019-01-22 (×3): 10 meq via ORAL
  Filled 2019-01-21 (×3): qty 1

## 2019-01-21 MED ORDER — METRONIDAZOLE 500 MG PO TABS
2000.0000 mg | ORAL_TABLET | Freq: Once | ORAL | Status: AC
  Administered 2019-01-21: 2000 mg via ORAL
  Filled 2019-01-21: qty 4

## 2019-01-21 MED ORDER — AZITHROMYCIN 250 MG PO TABS
1000.0000 mg | ORAL_TABLET | Freq: Once | ORAL | Status: AC
  Administered 2019-01-21: 1000 mg via ORAL
  Filled 2019-01-21: qty 4

## 2019-01-21 NOTE — Progress Notes (Signed)
HD # 2 PID Subjective: Patient reports feeling better. Little pain. Tolerating diet. Ambulating and voiding without probelms   Objective: AF  VSS Lungs clear Heart RRR Abd soft + BS min tenderness no rebound or guarding Ext non tender   Assessment/Plan: PID GC/C +  Trich HTN  Test results discussed with pt. Pt informed to have partner seen and treated and to avoid IC until TOC. Awaiting SW consult, they have been contacted by nursing to see in AM for discharge tomorrow. PO treatment given today and will continue IV antibiotics until discharge until tomorrow. Will complete 14 day course as outpt. BP controlled.    LOS: 1 day    Chancy Milroy 01/21/2019, 4:59 PM

## 2019-01-22 ENCOUNTER — Other Ambulatory Visit: Payer: Self-pay | Admitting: Obstetrics and Gynecology

## 2019-01-22 ENCOUNTER — Encounter: Payer: Self-pay | Admitting: Obstetrics and Gynecology

## 2019-01-22 LAB — CBC WITH DIFFERENTIAL/PLATELET
Abs Immature Granulocytes: 0.01 10*3/uL (ref 0.00–0.07)
Basophils Absolute: 0 10*3/uL (ref 0.0–0.1)
Basophils Relative: 1 %
Eosinophils Absolute: 0.2 10*3/uL (ref 0.0–0.5)
Eosinophils Relative: 3 %
HCT: 36.2 % (ref 36.0–46.0)
Hemoglobin: 11.9 g/dL — ABNORMAL LOW (ref 12.0–15.0)
Immature Granulocytes: 0 %
Lymphocytes Relative: 33 %
Lymphs Abs: 2 10*3/uL (ref 0.7–4.0)
MCH: 30.8 pg (ref 26.0–34.0)
MCHC: 32.9 g/dL (ref 30.0–36.0)
MCV: 93.8 fL (ref 80.0–100.0)
Monocytes Absolute: 0.5 10*3/uL (ref 0.1–1.0)
Monocytes Relative: 9 %
Neutro Abs: 3.2 10*3/uL (ref 1.7–7.7)
Neutrophils Relative %: 54 %
Platelets: 311 10*3/uL (ref 150–400)
RBC: 3.86 MIL/uL — ABNORMAL LOW (ref 3.87–5.11)
RDW: 11.9 % (ref 11.5–15.5)
WBC: 6 10*3/uL (ref 4.0–10.5)
nRBC: 0 % (ref 0.0–0.2)

## 2019-01-22 MED ORDER — IBUPROFEN 800 MG PO TABS: 800.0000 mg | ORAL_TABLET | Freq: Three times a day (TID) | ORAL | 0 refills | Status: DC | PRN

## 2019-01-22 MED ORDER — LISINOPRIL 10 MG PO TABS: 10.0000 mg | ORAL_TABLET | Freq: Every day | ORAL | 0 refills | Status: DC

## 2019-01-22 MED ORDER — OXYCODONE-ACETAMINOPHEN 5-325 MG PO TABS: 1.0000 | ORAL_TABLET | Freq: Four times a day (QID) | ORAL | 0 refills | Status: DC | PRN

## 2019-01-22 NOTE — Progress Notes (Signed)
Writer paged Dr. Arlina Robes regarding patient's blood pressure of 189/89 at 0806.

## 2019-01-22 NOTE — Discharge Summary (Signed)
Physician Discharge Summary  Patient ID: Debra Obrien MRN: [id number] DOB/AGE: 12/20/1980 38 y.o.  Admit date: 01/19/2019 Discharge date: 01/22/2019  Admission Diagnoses: PID, Hypertension  Discharge Diagnoses:  Active Problems:   PID (acute pelvic inflammatory disease)   Discharged Condition: good  Hospital Course: Debra Obrien was admitted with above Dx. She was started on IV antibiotics. Cultures returned as positive for trich and GC/C. She was informed of this and sexual precautions and partner eval and treatment reviewed.  She remained afebrile during the hospitalization and her abd/pelvic pain resolved. She was treated with oral azithromycin and flagyl as well. Will complete a 14 day treatment with doxycycline and flagyl as an outpt. She was started on Linsinoprl for BP and BP's were improving.  She progressed to ambulating, voiding, and tolerating diet without problems  She was seen by social worker who assisted pt in obtaining an appt with a PCP and help with meds.  Felt pt was amendable for discharge home. Discharge instructions, medications, follow up and letter of hospitalization reviewed and provided to pt.   Consults: None  Significant Diagnostic Studies: labs   Treatments: antibiotics  Discharge Exam: Blood pressure (!) 170/95, pulse 69, temperature 99.1 F (37.3 C), temperature source Oral, resp. rate 16, height 5\' 9"  (1.753 m), weight 99.8 kg, last menstrual period 01/17/2019, SpO2 100 %, unknown if currently breastfeeding.  Lungs clear Heart RRR Abd soft + BS  non tender Ext non tender  Disposition: Discharge disposition: 01-Home or Self Care       Discharge Instructions    Call MD for:  difficulty breathing, headache or visual disturbances   Complete by: As directed    Call MD for:  extreme fatigue   Complete by: As directed    Call MD for:  persistant dizziness or light-headedness   Complete by: As directed    Call MD for:  persistant nausea and  vomiting   Complete by: As directed    Call MD for:  redness, tenderness, or signs of infection (pain, swelling, redness, odor or green/yellow discharge around incision site)   Complete by: As directed    Call MD for:  severe uncontrolled pain   Complete by: As directed    Call MD for:  temperature >100.4   Complete by: As directed    Diet - low sodium heart healthy   Complete by: As directed    Increase activity slowly   Complete by: As directed      Allergies as of 01/22/2019   No Known Allergies     Medication List    STOP taking these medications   docusate sodium 100 MG capsule Commonly known as: COLACE   methyldopa 250 MG tablet Commonly known as: ALDOMET     TAKE these medications   ibuprofen 800 MG tablet Commonly known as: ADVIL Take 1 tablet (800 mg total) by mouth every 8 (eight) hours as needed for fever, mild pain or moderate pain. What changed:   medication strength  how much to take  when to take this  reasons to take this   lisinopril 10 MG tablet Commonly known as: ZESTRIL Take 1 tablet (10 mg total) by mouth daily. Start taking on: January 23, 2019   oxyCODONE-acetaminophen 5-325 MG tablet Commonly known as: PERCOCET/ROXICET Take 1 tablet by mouth every 6 (six) hours as needed (moderate to severe pain (when tolerating fluids)).      Follow-up Information    [facility] [place] [facility] Follow up.  Specialty: Family Medicine Why: appointment scheduled to establish with Primary Care Physician for Hypertension Contact information: [place] [phone] [phone]       Center for [facility]. Schedule an appointment as soon as possible for a visit in 4 week(s).   Specialty: Obstetrics and Gynecology Why: 4 weeks hospital follow up Contact information: [address] [place], [place] [id number] [place] [id number] [phone]           Signed: Chancy Milroy 01/22/2019, 1:00 PM

## 2019-01-22 NOTE — Progress Notes (Signed)
Patient refused to be given discharge instructions to. Patient stated she can read. Writer gave her the papers. NT took patient out

## 2019-01-22 NOTE — Discharge Instructions (Signed)
Pelvic Inflammatory Disease  Pelvic inflammatory disease (PID) is an infection in some or all of the female reproductive organs. PID can be in the womb (uterus), ovaries, fallopian tubes, or nearby tissues that are inside the lower belly area (pelvis). PID can lead to problems if it is not treated. What are the causes?  Germs (bacteria) that are spread during sex. This is the most common cause.  Germs in the vagina that are not spread during sex.  Germs that travel up from the vagina or cervix to the reproductive organs after: ? The birth of a baby. ? A miscarriage. ? An abortion. ? Pelvic surgery. ? Insertion of an intrauterine device (IUD). ? A sexual assault. What increases the risk?  Being younger than 39 years old.  Having sex at a young age.  Having a history of STI (sexually transmitted infection) or PID.  Not using barrier birth control, such as condoms.  Having a lot of sex partners.  Having sex with someone who has symptoms of an STI.  Using a douche.  Having an IUD put in place. What are the signs or symptoms?  Pain in the belly area.  Fever.  Chills.  Discharge from the vagina that is not normal.  Bleeding from the womb that is not normal.  Pain soon after the end of a menstrual period.  Pain when you pee (urinate).  Pain with sex.  Feeling sick to your stomach (nauseous) or throwing up (vomiting). How is this treated?  Antibiotic medicines. In very bad cases, these may be given through an IV tube.  Surgery. This is rare.  Efforts to stop the spread of the infection. Sex partners may need to be treated. It may take weeks until you feel all better. Your doctor may test you for infection again after you finish treatment. You should also be checked for HIV (human immunodeficiency virus). Follow these instructions at home:  Take over-the-counter and prescription medicines only as told by your doctor.  If you were prescribed an antibiotic  medicine, take it as told by your doctor. Do not stop taking it even if you start to feel better.  Do not have sex until treatment is done or as told by your doctor.  Tell your sex partner if you have PID. Your partner may need to be treated.  Keep all follow-up visits as told by your doctor. This is important. Contact a doctor if:  You have more fluid or fluid that is not normal coming from your vagina.  Your pain does not improve.  You throw up.  You have a fever.  You cannot take your medicines.  Your partner has an STI.  You have pain when you pee. Get help right away if:  You have more pain in the belly area.  You have chills.  You are not better in 72 hours with treatment. Summary  Pelvic inflammatory disease (PID) is caused by an infection in some or all of the female reproductive organs.  PID is a serious infection.  This infection is most often treated with antibiotics.  Do not have sex until treatment is done or as told by your doctor. This information is not intended to replace advice given to you by your health care provider. Make sure you discuss any questions you have with your health care provider. Document Released: 07/13/2008 Document Revised: 01/02/2018 Document Reviewed: 01/08/2018 Elsevier Patient Education  2020 Elsevier Inc.  

## 2019-01-22 NOTE — Progress Notes (Signed)
Patient refused her lisinopril. Writer educated patient on her blood pressure and medication. Patient continued to refuse her lisinopril

## 2019-01-22 NOTE — TOC Progression Note (Signed)
Transition of Care [facility]) - Progression Note    Patient Details  Name: Debra Obrien MRN: [id number] Date of Birth: August 22, 1980  Transition of Care [facility]) CM/SW [place], [place] Phone Number: 01/22/2019, 9:49 AM  Clinical Narrative:    CSW inform the patient about her follow up appt. On 02-09-2019 @9 :50am at [facility].   Patient request a letter from physician indication her admission date and discharge date/ when she can return to work.   CSW provided/ explain the [facility] letter for medication assistance. Patient reports understanding.  Patient has no transportation needs.     Expected Discharge Plan: Home/Self Care Barriers to Discharge: Barriers Resolved.   Expected Discharge Plan and Services Expected Discharge Plan: Home/Self Care In-house Referral: PCP / [facility] Discharge Planning Services: CM Consult, [place] [facility]   Living arrangements for the past 2 months: Apartment Expected Discharge Date: (unknown)                                     Social Determinants of Health (SDOH) Interventions    Readmission Risk Interventions No flowsheet data found.

## 2019-01-23 ENCOUNTER — Other Ambulatory Visit: Payer: Self-pay | Admitting: Obstetrics and Gynecology

## 2019-01-23 DIAGNOSIS — N730 Acute parametritis and pelvic cellulitis: Secondary | ICD-10-CM

## 2019-01-23 MED ORDER — DOXYCYCLINE HYCLATE 100 MG PO CAPS: 100.0000 mg | ORAL_CAPSULE | Freq: Two times a day (BID) | ORAL | 0 refills | Status: AC

## 2019-01-23 MED ORDER — METRONIDAZOLE 500 MG PO TABS: 500.0000 mg | ORAL_TABLET | Freq: Two times a day (BID) | ORAL | 0 refills | Status: AC

## 2019-02-09 ENCOUNTER — Other Ambulatory Visit (INDEPENDENT_AMBULATORY_CARE_PROVIDER_SITE_OTHER): Payer: Self-pay | Admitting: Primary Care

## 2019-02-09 ENCOUNTER — Telehealth (INDEPENDENT_AMBULATORY_CARE_PROVIDER_SITE_OTHER): Payer: Self-pay | Admitting: Primary Care

## 2019-02-09 DIAGNOSIS — Z09 Encounter for follow-up examination after completed treatment for conditions other than malignant neoplasm: Secondary | ICD-10-CM

## 2019-02-09 DIAGNOSIS — N730 Acute parametritis and pelvic cellulitis: Secondary | ICD-10-CM

## 2019-02-09 DIAGNOSIS — I10 Essential (primary) hypertension: Secondary | ICD-10-CM

## 2019-02-09 DIAGNOSIS — Z7689 Persons encountering health services in other specified circumstances: Secondary | ICD-10-CM

## 2019-02-09 MED ORDER — AMLODIPINE BESYLATE 10 MG PO TABS: 10.0000 mg | ORAL_TABLET | Freq: Every day | ORAL | 0 refills | Status: DC

## 2019-02-09 NOTE — Progress Notes (Signed)
Virtual Visit via Telephone Note  I connected with Debra Obrien on 02/09/19 at  9:50 AM EDT by telephone and verified that I am speaking with the correct person using two identifiers.   I discussed the limitations, risks, security and privacy concerns of performing an evaluation and management service by telephone and the availability of in person appointments. I also discussed with the patient that there may be a patient responsible charge related to this service. The patient expressed understanding and agreed to proceed.  Past Medical History:  Diagnosis Date  . Hypertension    History of Present Illness: [name] was having a tele visit after hospital discharge on 01/19/2019 for abdominal pain left lower quadrant and pelvic inflammatory disease.Blood pressure in the ED-hospital admission was elevated. Patient states the last Bp medication made her sick so she stopped .She has no PCP on file she will be establishing primary care to. She voices no problems or complaints at this time. Bloo Past Medical History:  Diagnosis Date  . Hypertension      Observations/Objective: Review of Systems  Skin:       Areolar tender at times   All other systems reviewed and are negative.   Assessment and Plan: Debra Obrien was seen today for hospitalization follow-up.  Diagnoses and all orders for this visit:  Encounter to establish care Juluis Mire, NP-C will be your  (PCP) that will  provides both the first contact for a person with an undiagnosed health concern as well as continuing care of varied medical conditions, not limited by cause, organ system, or diagnosis.  PID (acute pelvic inflammatory disease)  Treated with flagyl and doxycyline positive for tric/GC/C 7 day course of antibiotics  Essential hypertension This was very difficult trying to convince her she needed to be on a Bp medication. Discussed CCB work better in [employer]. She did not want to be a test dummy.  Explained she can wait until in person visit but in the meantime she is putting herself at risk for a heart attack or stroke. Patient reluctantly said she will take amlodipine 10mg  daily. Medication sent to the pharmacy.  Hospital discharge follow-up Admitted and discharge with PID treated with metronidazole 500mg  bid for 14 days and doxycyline 100mg  bid for 14 days. Cultures were positive for tric and GC/C. Social worker schedule appointment to establish care to treat for HTN.  Other orders -     amLODipine (NORVASC) 10 MG tablet; Take 1 tablet (10 mg total) by mouth daily.    Follow Up Instructions:    I discussed the assessment and treatment plan with the patient. The patient was provided an opportunity to ask questions and all were answered. The patient agreed with the plan and demonstrated an understanding of the instructions.   The patient was advised to call back or seek an in-person evaluation if the symptoms worsen or if the condition fails to improve as anticipated.  I provided 38 minutes of non-face-to-face time during this encounter. Also includes couseling on hypertension, review of medical records, labs   Kerin Perna, NP

## 2019-02-09 NOTE — Progress Notes (Signed)
Pt stopped taking lisinopril due to it making her feel sick and tired and loss of appetite

## 2019-02-19 ENCOUNTER — Other Ambulatory Visit: Payer: Self-pay

## 2019-02-19 DIAGNOSIS — Z20822 Contact with and (suspected) exposure to covid-19: Secondary | ICD-10-CM

## 2019-02-21 LAB — NOVEL CORONAVIRUS, NAA: SARS-CoV-2, NAA: NOT DETECTED

## 2019-02-26 ENCOUNTER — Encounter: Payer: Self-pay | Admitting: Obstetrics and Gynecology

## 2019-02-26 ENCOUNTER — Other Ambulatory Visit: Payer: Self-pay

## 2019-02-26 ENCOUNTER — Ambulatory Visit (INDEPENDENT_AMBULATORY_CARE_PROVIDER_SITE_OTHER): Payer: Self-pay | Admitting: Obstetrics and Gynecology

## 2019-02-26 VITALS — BP 194/119 | HR 87 | Wt 264.0 lb

## 2019-02-26 DIAGNOSIS — Z8742 Personal history of other diseases of the female genital tract: Secondary | ICD-10-CM

## 2019-02-26 DIAGNOSIS — I161 Hypertensive emergency: Secondary | ICD-10-CM | POA: Insufficient documentation

## 2019-02-26 NOTE — Progress Notes (Signed)
GYNECOLOGY ENCOUNTER NOTE  History:      [name] is a 38 y.o. female s/p hospital admission d/t PID on 9/21. She completed IV antibiotics and outpatient antibiotics. She feels completely better. She has 0/10 pain. She has not resumed intercourse. She is not taking her BP medication because it "did not agree with her". She is very aware of her stroke risk and states she is "not afraid".  She called her Dr. Gabriel Carina today to see if they can switch her BP medications and plans to fill the RX today.  No HA, No fever.   Obstetric History OB History  Gravida Para Term Preterm AB Living  6 3 2 1 2 3   SAB TAB Ectopic Multiple Live Births    2          # Outcome Date GA Lbr Len/2nd Weight Sex Delivery Anes PTL Lv  6 Gravida           5 Preterm      Vag-Spont     4 Term      Vag-Spont     3 Term      Vag-Spont     2 TAB           1 TAB             Past Medical History:  Diagnosis Date  . Hypertension     No past surgical history on file.  Current Outpatient Medications on File Prior to Visit  Medication Sig Dispense Refill  . amLODipine (NORVASC) 10 MG tablet Take 1 tablet (10 mg total) by mouth daily. 30 tablet 0  . ibuprofen (ADVIL) 800 MG tablet Take 1 tablet (800 mg total) by mouth every 8 (eight) hours as needed for fever, mild pain or moderate pain. (Patient not taking: Reported on 02/09/2019) 30 tablet 0  . lisinopril (ZESTRIL) 10 MG tablet TAKE 1 TABLET BY MOUTH DAILY (Patient not taking: Reported on 02/09/2019) 90 tablet 2   No current facility-administered medications on file prior to visit.     No Known Allergies  Social History:  reports that she has been smoking cigarettes. She has been smoking about 1.00 pack per day. She has never used smokeless tobacco. She reports current alcohol use. She reports current drug use. Drugs: Marijuana and Cocaine.  Family History  Problem Relation Age of Onset  . Diabetes Father     The following portions of the  patient's history were reviewed and updated as appropriate: allergies, current medications, past family history, past medical history, past social history, past surgical history and problem list.  Review of Systems Pertinent items noted in HPI and remainder of comprehensive ROS otherwise negative.  Physical Exam:  BP (!) 194/119   Pulse 87   Wt 264 lb (119.7 kg)   BMI 38.99 kg/m  CONSTITUTIONAL: Well-developed, well-nourished female in no acute distress.  HENT:  Normocephalic, atraumatic, External right and left ear normal. Oropharynx is clear and moist EYES: Conjunctivae and EOM are normal. Pupils are equal, round, and reactive to light. No scleral icterus.  NECK: Normal range of motion, supple, no masses.  Marland Kitchen  SKIN: Skin is warm and dry. No rash noted. Not diaphoretic. No erythema. No pallor. MUSCULOSKELETAL: Normal range of motion. No tenderness.  No cyanosis, clubbing, or edema.  2+ distal pulses. PSYCHIATRIC: Normal mood and affect. Normal behavior. Normal judgment and thought content. CARDIOVASCULAR: Normal heart rate noted, regular rhythm RESPIRATORY: Clear to auscultation bilaterally. Effort and breath  sounds normal, no problems with respiration noted. ABDOMEN: Soft, normal bowel sounds, no distention noted.  No tenderness, rebound or guarding.    Assessment and Plan:   PID:  F/u today from hospital admission. Doing well. No pain, no fever. No leukocytosis at discharge. Call the office or go to ED if symptoms return. Condoms always.   Severe HTN:  Recommend she go to the ED now for BP management of severe HTN. It was stressed to her the risk of stroke associated with BP's this high.  Encouraged PCP for management of chronic issues.    Debra Obrien, Harolyn Rutherford, NP [facility] [employer], [facility]

## 2019-04-01 ENCOUNTER — Other Ambulatory Visit (INDEPENDENT_AMBULATORY_CARE_PROVIDER_SITE_OTHER): Payer: Self-pay | Admitting: Primary Care

## 2019-04-01 MED ORDER — AMLODIPINE BESYLATE 10 MG PO TABS: 10.0000 mg | ORAL_TABLET | Freq: Every day | ORAL | 0 refills | Status: DC

## 2019-04-15 ENCOUNTER — Ambulatory Visit: Payer: Self-pay | Attending: Internal Medicine

## 2019-04-15 ENCOUNTER — Other Ambulatory Visit: Payer: Self-pay

## 2019-04-15 DIAGNOSIS — Z20822 Contact with and (suspected) exposure to covid-19: Secondary | ICD-10-CM

## 2019-04-17 LAB — NOVEL CORONAVIRUS, NAA: SARS-CoV-2, NAA: NOT DETECTED

## 2019-04-28 ENCOUNTER — Encounter (INDEPENDENT_AMBULATORY_CARE_PROVIDER_SITE_OTHER): Payer: Self-pay | Admitting: Primary Care

## 2019-05-13 ENCOUNTER — Other Ambulatory Visit (INDEPENDENT_AMBULATORY_CARE_PROVIDER_SITE_OTHER): Payer: Self-pay | Admitting: Primary Care

## 2019-05-13 MED ORDER — AMLODIPINE BESYLATE 10 MG PO TABS: 10.0000 mg | ORAL_TABLET | Freq: Every day | ORAL | 1 refills | Status: DC

## 2019-05-26 ENCOUNTER — Ambulatory Visit: Payer: Self-pay | Attending: Internal Medicine

## 2019-05-26 DIAGNOSIS — Z20822 Contact with and (suspected) exposure to covid-19: Secondary | ICD-10-CM

## 2019-05-27 LAB — NOVEL CORONAVIRUS, NAA: SARS-CoV-2, NAA: NOT DETECTED

## 2019-05-29 ENCOUNTER — Emergency Department (HOSPITAL_COMMUNITY)
Admission: EM | Admit: 2019-05-29 | Discharge: 2019-05-29 | Disposition: A | Discharge status: Home / Self Care | Payer: Self-pay | Attending: Emergency Medicine | Admitting: Emergency Medicine

## 2019-05-29 DIAGNOSIS — R197 Diarrhea, unspecified: Secondary | ICD-10-CM | POA: Insufficient documentation

## 2019-05-29 DIAGNOSIS — F17210 Nicotine dependence, cigarettes, uncomplicated: Secondary | ICD-10-CM | POA: Insufficient documentation

## 2019-05-29 DIAGNOSIS — I10 Essential (primary) hypertension: Secondary | ICD-10-CM | POA: Insufficient documentation

## 2019-05-29 DIAGNOSIS — R112 Nausea with vomiting, unspecified: Secondary | ICD-10-CM | POA: Insufficient documentation

## 2019-05-29 LAB — CBC WITH DIFFERENTIAL/PLATELET
Abs Immature Granulocytes: 0.04 10*3/uL (ref 0.00–0.07)
Basophils Absolute: 0.1 10*3/uL (ref 0.0–0.1)
Basophils Relative: 1 %
Eosinophils Absolute: 0.1 10*3/uL (ref 0.0–0.5)
Eosinophils Relative: 1 %
HCT: 43.7 % (ref 36.0–46.0)
Hemoglobin: 14.4 g/dL (ref 12.0–15.0)
Immature Granulocytes: 1 %
Lymphocytes Relative: 35 %
Lymphs Abs: 3.1 10*3/uL (ref 0.7–4.0)
MCH: 30.6 pg (ref 26.0–34.0)
MCHC: 33 g/dL (ref 30.0–36.0)
MCV: 93 fL (ref 80.0–100.0)
Monocytes Absolute: 0.6 10*3/uL (ref 0.1–1.0)
Monocytes Relative: 7 %
Neutro Abs: 5 10*3/uL (ref 1.7–7.7)
Neutrophils Relative %: 55 %
Platelets: 294 10*3/uL (ref 150–400)
RBC: 4.7 MIL/uL (ref 3.87–5.11)
RDW: 12.6 % (ref 11.5–15.5)
WBC: 8.9 10*3/uL (ref 4.0–10.5)
nRBC: 0 % (ref 0.0–0.2)

## 2019-05-29 LAB — CBG MONITORING, ED: Glucose-Capillary: 90 mg/dL (ref 70–99)

## 2019-05-29 LAB — COMPREHENSIVE METABOLIC PANEL
ALT: 23 U/L (ref 0–44)
AST: 29 U/L (ref 15–41)
Albumin: 3.6 g/dL (ref 3.5–5.0)
Alkaline Phosphatase: 74 U/L (ref 38–126)
Anion gap: 15 (ref 5–15)
BUN: 14 mg/dL (ref 6–20)
CO2: 18 mmol/L — ABNORMAL LOW (ref 22–32)
Calcium: 8.9 mg/dL (ref 8.9–10.3)
Chloride: 102 mmol/L (ref 98–111)
Creatinine, Ser: 0.93 mg/dL (ref 0.44–1.00)
GFR calc Af Amer: >60 mL/min (ref >60)
GFR calc non Af Amer: >60 mL/min (ref >60)
Glucose, Bld: 88 mg/dL (ref 70–99)
Potassium: 4.2 mmol/L (ref 3.5–5.1)
Sodium: 135 mmol/L (ref 135–145)
Total Bilirubin: 0.5 mg/dL (ref 0.3–1.2)
Total Protein: 7.3 g/dL (ref 6.5–8.1)

## 2019-05-29 MED ORDER — ONDANSETRON HCL 4 MG/2ML IJ SOLN
4.0000 mg | Freq: Once | INTRAMUSCULAR | Status: AC
  Administered 2019-05-29: 4 mg via INTRAVENOUS
  Filled 2019-05-29: qty 2

## 2019-05-29 MED ORDER — ONDANSETRON 4 MG PO TBDP: 4.0000 mg | ORAL_TABLET | Freq: Three times a day (TID) | ORAL | 0 refills | Status: DC | PRN

## 2019-05-29 MED ORDER — SODIUM CHLORIDE 0.9 % IV BOLUS
1000.0000 mL | Freq: Once | INTRAVENOUS | Status: AC
  Administered 2019-05-29: 13:00:00 1000 mL via INTRAVENOUS

## 2019-05-29 NOTE — Discharge Instructions (Signed)
Your testing today was unremarkable, please take Zofran every 6 hours as needed for nausea, drink plenty of fluids, you may take Imodium as needed for diarrhea, pick this up at the pharmacy as needed if you continue to have watery diarrhea.  Emergency department for severe or worsening pain nausea vomiting diarrhea or blood in the stools  If you do not have a family doctor please see the phone number above for the [facility] health community and wellness clinic
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
## 2019-05-29 NOTE — ED Triage Notes (Signed)
Pt to ED via EMS from home. N&V X 2 DAYS, unwitnessed syncopal episode aprox 45 mins ago. HX HTN, Covid negative test on Wed. #20 L hand, #20 R hand , 4mg  Zofran NS Bolus given EMS. Hypotensive on EMS arrival bp: 97/56  Last BP:113/85,

## 2019-05-29 NOTE — ED Notes (Signed)
Pt d/c home per MD order. Discharge summary reviewed with pt, pt verbalizes understanding. No s/s of acute distress noted. Pt family her for d/c ride home.

## 2019-06-07 ENCOUNTER — Emergency Department (HOSPITAL_COMMUNITY)
Admission: EM | Admit: 2019-06-07 | Discharge: 2019-06-07 | Disposition: A | Discharge status: Home / Self Care | Payer: Self-pay | Attending: Emergency Medicine | Admitting: Emergency Medicine

## 2019-06-07 ENCOUNTER — Other Ambulatory Visit: Payer: Self-pay

## 2019-06-07 ENCOUNTER — Encounter (HOSPITAL_COMMUNITY): Payer: Self-pay | Admitting: Emergency Medicine

## 2019-06-07 DIAGNOSIS — I10 Essential (primary) hypertension: Secondary | ICD-10-CM | POA: Insufficient documentation

## 2019-06-07 DIAGNOSIS — F17210 Nicotine dependence, cigarettes, uncomplicated: Secondary | ICD-10-CM | POA: Insufficient documentation

## 2019-06-07 DIAGNOSIS — Y92410 Unspecified street and highway as the place of occurrence of the external cause: Secondary | ICD-10-CM | POA: Insufficient documentation

## 2019-06-07 DIAGNOSIS — Y93I9 Activity, other involving external motion: Secondary | ICD-10-CM | POA: Insufficient documentation

## 2019-06-07 DIAGNOSIS — Y999 Unspecified external cause status: Secondary | ICD-10-CM | POA: Insufficient documentation

## 2019-06-07 DIAGNOSIS — S8001XA Contusion of right knee, initial encounter: Secondary | ICD-10-CM | POA: Insufficient documentation

## 2019-06-07 DIAGNOSIS — M791 Myalgia, unspecified site: Secondary | ICD-10-CM | POA: Insufficient documentation

## 2019-06-07 DIAGNOSIS — Z79899 Other long term (current) drug therapy: Secondary | ICD-10-CM | POA: Insufficient documentation

## 2019-06-07 DIAGNOSIS — M7918 Myalgia, other site: Secondary | ICD-10-CM

## 2019-06-07 MED ORDER — LIDOCAINE 5 % EX PTCH
2.0000 | MEDICATED_PATCH | CUTANEOUS | Status: DC
  Administered 2019-06-07: 2 via TRANSDERMAL
  Filled 2019-06-07: qty 2

## 2019-06-07 MED ORDER — CYCLOBENZAPRINE HCL 10 MG PO TABS: 10.0000 mg | ORAL_TABLET | Freq: Two times a day (BID) | ORAL | 0 refills | Status: DC | PRN

## 2019-06-07 MED ORDER — IBUPROFEN 800 MG PO TABS
800.0000 mg | ORAL_TABLET | Freq: Once | ORAL | Status: AC
  Administered 2019-06-07: 800 mg via ORAL
  Filled 2019-06-07: qty 1
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
## 2019-06-07 NOTE — Discharge Instructions (Signed)
Take Flexeril as needed as prescribed for muscle tightness, do not drive or operate machinery while taking Flexeril. Take Tylenol as needed as directed.  Apply Voltaren gel to sore areas as directed. Warm compresses to sore muscles for 20 minutes at a time, three times daily. Follow up with your doctor this week for recheck.

## 2019-06-07 NOTE — ED Notes (Signed)
Discharge instructions reviewed with pt. Pt verbalized understanding.   

## 2019-06-26 ENCOUNTER — Ambulatory Visit: Payer: Self-pay | Attending: Internal Medicine

## 2019-06-26 DIAGNOSIS — Z20822 Contact with and (suspected) exposure to covid-19: Secondary | ICD-10-CM

## 2019-06-27 LAB — NOVEL CORONAVIRUS, NAA: SARS-CoV-2, NAA: NOT DETECTED

## 2019-09-02 ENCOUNTER — Inpatient Hospital Stay
Admission: RE | Admit: 2019-09-02 | Discharge: 2019-09-02 | Disposition: A | Discharge status: Home / Self Care | Payer: Self-pay | Source: Ambulatory Visit

## 2019-10-29 ENCOUNTER — Ambulatory Visit (INDEPENDENT_AMBULATORY_CARE_PROVIDER_SITE_OTHER): Payer: Managed Care, Other (non HMO) | Admitting: Primary Care

## 2019-11-10 ENCOUNTER — Ambulatory Visit (INDEPENDENT_AMBULATORY_CARE_PROVIDER_SITE_OTHER): Payer: Managed Care, Other (non HMO) | Admitting: Primary Care

## 2019-11-10 ENCOUNTER — Encounter (INDEPENDENT_AMBULATORY_CARE_PROVIDER_SITE_OTHER): Payer: Self-pay

## 2019-11-23 ENCOUNTER — Other Ambulatory Visit (INDEPENDENT_AMBULATORY_CARE_PROVIDER_SITE_OTHER): Payer: Self-pay | Admitting: Primary Care

## 2019-11-23 NOTE — Telephone Encounter (Signed)
Requested medication (s) are due for refill today: no  Requested medication (s) are on the active medication list: yes  Last refill:  08/28/2019  Future visit scheduled: no  Notes to clinic: patient overdue for office visit    Requested Prescriptions  Pending Prescriptions Disp Refills   amLODipine (NORVASC) 10 MG tablet [Pharmacy Med Name: AMLODIPINE BESYLATE 10MG  TABLETS] 90 tablet 1    Sig: TAKE 1 TABLET(10 MG) BY MOUTH DAILY      Cardiovascular:  Calcium Channel Blockers Failed - 11/23/2019  3:25 AM      Failed - Last BP in normal range    BP Readings from Last 1 Encounters:  06/07/19 (!) 162/95          Failed - Valid encounter within last 6 months    Recent Outpatient Visits           9 months ago Encounter to establish care   [facility], NP

## 2020-05-07 ENCOUNTER — Emergency Department (HOSPITAL_COMMUNITY)
Admission: EM | Admit: 2020-05-07 | Discharge: 2020-05-07 | Disposition: A | Discharge status: Home / Self Care | Payer: Self-pay | Attending: Emergency Medicine | Admitting: Emergency Medicine

## 2020-05-07 ENCOUNTER — Emergency Department (HOSPITAL_COMMUNITY): Payer: Self-pay

## 2020-05-07 ENCOUNTER — Encounter (HOSPITAL_COMMUNITY): Payer: Self-pay | Admitting: Emergency Medicine

## 2020-05-07 ENCOUNTER — Other Ambulatory Visit: Payer: Self-pay

## 2020-05-07 DIAGNOSIS — R531 Weakness: Secondary | ICD-10-CM | POA: Insufficient documentation

## 2020-05-07 DIAGNOSIS — H539 Unspecified visual disturbance: Secondary | ICD-10-CM

## 2020-05-07 DIAGNOSIS — F17210 Nicotine dependence, cigarettes, uncomplicated: Secondary | ICD-10-CM | POA: Insufficient documentation

## 2020-05-07 DIAGNOSIS — I10 Essential (primary) hypertension: Secondary | ICD-10-CM | POA: Insufficient documentation

## 2020-05-07 DIAGNOSIS — H538 Other visual disturbances: Secondary | ICD-10-CM | POA: Insufficient documentation

## 2020-05-07 DIAGNOSIS — Z79899 Other long term (current) drug therapy: Secondary | ICD-10-CM | POA: Insufficient documentation

## 2020-05-07 LAB — DIFFERENTIAL
Abs Immature Granulocytes: 0.03 10*3/uL (ref 0.00–0.07)
Basophils Absolute: 0 10*3/uL (ref 0.0–0.1)
Basophils Relative: 0 %
Eosinophils Absolute: 0.3 10*3/uL (ref 0.0–0.5)
Eosinophils Relative: 3 %
Immature Granulocytes: 0 %
Lymphocytes Relative: 43 %
Lymphs Abs: 3.2 10*3/uL (ref 0.7–4.0)
Monocytes Absolute: 0.4 10*3/uL (ref 0.1–1.0)
Monocytes Relative: 5 %
Neutro Abs: 3.6 10*3/uL (ref 1.7–7.7)
Neutrophils Relative %: 49 %

## 2020-05-07 LAB — CBC
HCT: 37.2 % (ref 36.0–46.0)
Hemoglobin: 11.9 g/dL — ABNORMAL LOW (ref 12.0–15.0)
MCH: 28.3 pg (ref 26.0–34.0)
MCHC: 32 g/dL (ref 30.0–36.0)
MCV: 88.6 fL (ref 80.0–100.0)
Platelets: 482 10*3/uL — ABNORMAL HIGH (ref 150–400)
RBC: 4.2 MIL/uL (ref 3.87–5.11)
RDW: 13.2 % (ref 11.5–15.5)
WBC: 7.6 10*3/uL (ref 4.0–10.5)
nRBC: 0 % (ref 0.0–0.2)

## 2020-05-07 LAB — PROTIME-INR
INR: 0.9 (ref 0.8–1.2)
Prothrombin Time: 12 seconds (ref 11.4–15.2)

## 2020-05-07 LAB — COMPREHENSIVE METABOLIC PANEL
ALT: 14 U/L (ref 0–44)
AST: 17 U/L (ref 15–41)
Albumin: 3.6 g/dL (ref 3.5–5.0)
Alkaline Phosphatase: 67 U/L (ref 38–126)
Anion gap: 14 (ref 5–15)
BUN: 12 mg/dL (ref 6–20)
CO2: 26 mmol/L (ref 22–32)
Calcium: 9.1 mg/dL (ref 8.9–10.3)
Chloride: 99 mmol/L (ref 98–111)
Creatinine, Ser: 1.01 mg/dL — ABNORMAL HIGH (ref 0.44–1.00)
GFR, Estimated: >60 mL/min (ref >60)
Glucose, Bld: 134 mg/dL — ABNORMAL HIGH (ref 70–99)
Potassium: 3.2 mmol/L — ABNORMAL LOW (ref 3.5–5.1)
Sodium: 139 mmol/L (ref 135–145)
Total Bilirubin: 0.7 mg/dL (ref 0.3–1.2)
Total Protein: 7.4 g/dL (ref 6.5–8.1)

## 2020-05-07 LAB — I-STAT BETA HCG BLOOD, ED (MC, WL, AP ONLY): I-stat hCG, quantitative: <5 m[IU]/mL (ref <5)

## 2020-05-07 LAB — APTT: aPTT: 29 seconds (ref 24–36)

## 2020-05-07 MED ORDER — AMLODIPINE BESYLATE 10 MG PO TABS: 10.0000 mg | ORAL_TABLET | Freq: Every day | ORAL | 0 refills | Status: DC

## 2020-05-07 MED ORDER — SODIUM CHLORIDE 0.9% FLUSH: 3.0000 mL | Freq: Once | INTRAVENOUS | Status: DC

## 2020-05-07 NOTE — ED Notes (Signed)
Patient transported to CT 

## 2020-05-07 NOTE — ED Triage Notes (Signed)
Pt reports floater in L eye, L arm and L side of face feeling heavy since Thursday.  Also reports dizziness.  Slight L arm drift on triage exam.

## 2020-05-07 NOTE — ED Provider Notes (Signed)
[facility] EMERGENCY DEPARTMENT Provider Note   CSN: [id number] Arrival date & time: 05/07/20  1052     History Chief Complaint  Patient presents with  . Weakness    Debra Obrien is a 40 y.o. female.  The history is provided by the patient. No language interpreter was used.  Weakness    40 year old female significant history of hypertension presenting complaining of visual changes.  Patient report 2 days ago in the morning she felt a pop behind her right ear follow-up with spot in her left visual field.  She also endorsed some blurry vision affecting her left eye.  When ambulating she feels a bit unsteady.  She also reports some decreased sensation to the left side of her face as well as occasional weakness to her left arm.  The symptom has been persistent mild to moderate in severity but not going away.  She denies any specific treatment tried.  She denies any significant headache, confusion, diplopia, chest pain, trouble breathing, or associated pain.  She has not been vaccinated for COVID-19.  She denies any prior history of stroke or heart attack.  She does have history of hypertension but have not been taking her medication for several months she does not have a PCP.  She does smoke approximately a pack of cigarettes a day.  She does not wear contact lenses or use reading glasses  Past Medical History:  Diagnosis Date  . Hypertension     Patient Active Problem List   Diagnosis Date Noted  . History of PID 02/26/2019  . Hypertensive emergency 02/26/2019  . PID (acute pelvic inflammatory disease) 01/19/2019    History reviewed. No pertinent surgical history.   OB History    Gravida  6   Para  3   Term  2   Preterm  1   AB  2   Living  3     SAB      IAB  2   Ectopic      Multiple      Live Births              Family History  Problem Relation Age of Onset  . Diabetes Father     Social History   Tobacco Use  . Smoking  status: Current Every Day Smoker    Packs/day: 1.00    Types: Cigarettes  . Smokeless tobacco: Never Used  Vaping Use  . Vaping Use: Never used  Substance Use Topics  . Alcohol use: Yes    Comment: 1-2 times a month.   . Drug use: Yes    Types: Marijuana, Cocaine    Comment: Marijuana: Daily. Cocaine: Once every 2 weeks.     Home Medications Prior to Admission medications   Medication Sig Start Date End Date Taking? Authorizing Provider  amLODipine (NORVASC) 10 MG tablet Take 1 tablet (10 mg total) by mouth daily. 05/13/19   Grayce Sessions, NP  cyclobenzaprine (FLEXERIL) 10 MG tablet Take 1 tablet (10 mg total) by mouth 2 (two) times daily as needed for muscle spasms. 06/07/19   Jeannie Fend, PA-C  ibuprofen (ADVIL) 800 MG tablet Take 1 tablet (800 mg total) by mouth every 8 (eight) hours as needed for fever, mild pain or moderate pain. Patient not taking: Reported on 02/09/2019 01/22/19   Hermina Staggers, MD  lisinopril (ZESTRIL) 10 MG tablet TAKE 1 TABLET BY MOUTH DAILY Patient not taking: Reported on 02/09/2019 01/23/19   Alysia Penna,  Marolyn Hammock, MD  ondansetron (ZOFRAN ODT) 4 MG disintegrating tablet Take 1 tablet (4 mg total) by mouth every 8 (eight) hours as needed for nausea. 05/29/19   Eber Hong, MD    Allergies    Patient has no known allergies.  Review of Systems   Review of Systems  Neurological: Positive for weakness.  All other systems reviewed and are negative.   Physical Exam Updated Vital Signs BP (!) 164/92   Pulse 86   Temp 98.5 F (36.9 C) (Oral)   Resp 14   LMP 04/30/2020   SpO2 100%   Physical Exam Vitals and nursing note reviewed.  Constitutional:      General: She is not in acute distress.    Appearance: She is well-developed and well-nourished. She is obese.  HENT:     Head: Normocephalic and atraumatic.     Right Ear: Tympanic membrane normal.     Left Ear: Tympanic membrane normal.     Nose: Nose normal.     Mouth/Throat:     Mouth:  Mucous membranes are moist.  Eyes:     General:        Right eye: No discharge.        Left eye: No discharge.     Extraocular Movements: Extraocular movements intact.     Conjunctiva/sclera: Conjunctivae normal.  Cardiovascular:     Rate and Rhythm: Normal rate and regular rhythm.     Pulses: Normal pulses.     Heart sounds: Normal heart sounds.  Pulmonary:     Effort: Pulmonary effort is normal.     Breath sounds: Normal breath sounds.  Abdominal:     Palpations: Abdomen is soft.  Musculoskeletal:     Cervical back: Normal range of motion and neck supple. No rigidity or tenderness.  Skin:    Findings: No rash.  Neurological:     Mental Status: She is alert and oriented to person, place, and time.     Comments: Neurologic exam:  Speech clear, pupils equal round reactive to light, extraocular movements intact  Endorse seeing spot in left eye but no visual field deficit Cranial nerves III through XII normal including no facial droop Follows commands, moves all extremities x4, normal strength to bilateral upper and lower extremities at all major muscle groups including grip Sensation normal to light touch  Coordination intact, no limb ataxia, finger-nose-finger normal Rapid alternating movements normal No pronator drift Gait normal   Psychiatric:        Mood and Affect: Mood and affect and mood normal.     ED Results / Procedures / Treatments   Labs (all labs ordered are listed, but only abnormal results are displayed) Labs Reviewed  CBC - Abnormal; Notable for the following components:      Result Value   Hemoglobin 11.9 (*)    Platelets 482 (*)    All other components within normal limits  COMPREHENSIVE METABOLIC PANEL - Abnormal; Notable for the following components:   Potassium 3.2 (*)    Glucose, Bld 134 (*)    Creatinine, Ser 1.01 (*)    All other components within normal limits  PROTIME-INR  APTT  DIFFERENTIAL  I-STAT BETA HCG BLOOD, ED ([facility], [facility], [facility] ONLY)     EKG EKG Interpretation  Date/Time:  Saturday May 07 2020 11:21:38 EST Ventricular Rate:  85 PR Interval:  138 QRS Duration: 74 QT Interval:  388 QTC Calculation: 461 R Axis:   64 Text Interpretation: Normal sinus rhythm  Normal ECG No STEMI Confirmed by Alvester Chou [phone]) on 05/07/2020 2:29:15 PM    Date: 05/07/2020  Rate: 85  Rhythm: normal sinus rhythm  QRS Axis: normal  Intervals: normal  ST/T Wave abnormalities: normal  Conduction Disutrbances: none  Narrative Interpretation:   Old EKG Reviewed: No significant changes noted EKG reviewed and interpreted by me    Radiology CT HEAD WO CONTRAST  Result Date: 05/07/2020 CLINICAL DATA:  Floaters in LEFT eye. EXAM: CT HEAD WITHOUT CONTRAST TECHNIQUE: Contiguous axial images were obtained from the base of the skull through the vertex without intravenous contrast. COMPARISON:  None. FINDINGS: Brain: No evidence of acute infarction, hemorrhage, hydrocephalus, extra-axial collection or mass lesion/mass effect. Unchanged RIGHT tentorial calcification. Vascular: No hyperdense vessel or unexpected calcification. Skull: Normal. Negative for fracture or focal lesion. Sinuses/Orbits: No acute finding. Other: None. IMPRESSION: No acute intracranial abnormality. Electronically Signed   By: Meda Klinefelter MD   On: 05/07/2020 12:18    Procedures Procedures (including critical care time)  Medications Ordered in ED Medications  sodium chloride flush (NS) 0.9 % injection 3 mL (has no administration in time range)    ED Course  I have reviewed the triage vital signs and the nursing notes.  Pertinent labs & imaging results that were available during my care of the patient were reviewed by me and considered in my medical decision making (see chart for details).    MDM Rules/Calculators/A&P                          BP (!) 172/105   Pulse 77   Temp 98.5 F (36.9 C) (Oral)   Resp 15   LMP 04/30/2020   SpO2 100%   Final  Clinical Impression(s) / ED Diagnoses Final diagnoses:  Visual changes  Primary hypertension    Rx / DC Orders ED Discharge Orders         Ordered    amLODipine (NORVASC) 10 MG tablet  Daily        05/07/20 1525         Patient complaining of spot in her left vision as well as stinging sensation to the left side of face, dizziness and some heaviness sensation to her left arm. Last known normal 2 days ago. Does have history of hypertension and history of tobacco use. No obvious focal neuro deficit on exam aside from subjective decrease sensation left side of face without any facial droop. Normal grip strength, no pronator drift. No visual field loss. Initial head CT scan unremarkable. Labs are reassuring. Will obtain brain MRI to rule out stroke. If MRI result is negative, patient should follow-up with ophthalmology outpt for further evaluation.  3:14 PM Brain MRI was obtained however patient did not tolerate well due to claustrophobia. However radiologist report no acute infarction noted patient ambulate without difficulty, no obvious focal neuro deficit on my exam.  No focal weakness.  Labs are reassuring, at this time I recommend patient follow-up with ophthalmology for further care.  Care discussed with Dr. Renaye Rakers who agrees.    Fayrene Helper, PA-C 05/07/20 1527    Terald Sleeper, MD 05/07/20 [phone]

## 2020-05-07 NOTE — ED Notes (Signed)
Pt transported to MRI 

## 2020-05-07 NOTE — Discharge Instructions (Signed)
You have been evaluated for your visual changes.  An MRI of your brain was obtained without any acute stroke however it is limited due to your claustrophobia.  A CT scan of the head was obtained and it was normal.  Your blood pressure is elevated and you should resume your blood pressure medication which I have prescribed.  Call and follow-up closely with eye specialist for further care.  Return if you have any concern.

## 2020-06-06 ENCOUNTER — Telehealth: Payer: Self-pay | Admitting: Physician Assistant

## 2020-06-06 DIAGNOSIS — I10 Essential (primary) hypertension: Secondary | ICD-10-CM

## 2020-06-06 DIAGNOSIS — Z8742 Personal history of other diseases of the female genital tract: Secondary | ICD-10-CM

## 2020-06-06 DIAGNOSIS — Z202 Contact with and (suspected) exposure to infections with a predominantly sexual mode of transmission: Secondary | ICD-10-CM

## 2020-06-06 NOTE — Progress Notes (Signed)
Based on what you shared with me, I feel your condition warrants further evaluation and I recommend that you be seen for a face to face office visit. (1) We do not treat STI via e-visit. You need testing to ensure there are no other causes of symptoms as if there is concern for trich, unfortunately STIs can come in packs. This way proper treatment is given. (2) You need to contact your primary care provider as we do not manage chronic diseases or do refills via e-visit.    NOTE: If you entered your credit card information for this eVisit, you will not be charged. You may see a "hold" on your card for the $35 but that hold will drop off and you will not have a charge processed.   If you are having a true medical emergency please call 911.      For an urgent face to face visit, [facility] has five urgent care centers for your convenience:     [facility] at [facility] Directions [phone] [address] [place], [place] [address] . 10 am - 6pm Monday - Friday    [facility]) Get Driving Directions [phone] [address] [place], [place] [address] . 10 am to 8 pm Monday-Friday . 12 pm to 8 pm [facility] Urgent Care at [facility] Get Driving Directions [phone] [address], [address] [place], [place] [address] . 8 am to 8 pm Monday-Friday . 9 am to 6 pm Saturday . 11 am to 6 pm Sunday     [facility] at [facility] Get Driving Directions  [phone] [address] [place], [place] [address] . 8 am to 8 pm Monday-Friday . 8 am to 4 pm [facility] Urgent Care at [facility] Directions [phone] [address] [place], [place] [address] . 12 pm to 6 pm Monday-Friday      Your e-visit answers were reviewed by a board certified advanced clinical practitioner to complete your personal care  plan.  Thank you for using e-Visits.

## 2020-08-22 ENCOUNTER — Telehealth: Payer: Self-pay

## 2021-01-30 ENCOUNTER — Telehealth: Payer: Self-pay | Admitting: Physician Assistant

## 2021-01-30 DIAGNOSIS — H66002 Acute suppurative otitis media without spontaneous rupture of ear drum, left ear: Secondary | ICD-10-CM

## 2021-01-30 DIAGNOSIS — B9689 Other specified bacterial agents as the cause of diseases classified elsewhere: Secondary | ICD-10-CM

## 2021-01-30 DIAGNOSIS — J019 Acute sinusitis, unspecified: Secondary | ICD-10-CM

## 2021-01-30 MED ORDER — AMOXICILLIN-POT CLAVULANATE 875-125 MG PO TABS: 1.0000 | ORAL_TABLET | Freq: Two times a day (BID) | ORAL | 0 refills | Status: DC

## 2021-01-30 NOTE — Progress Notes (Signed)
Virtual Visit Consent   PATTI SHORB, you are scheduled for a virtual visit with a [facility] provider today.     Just as with appointments in the office, your consent must be obtained to participate.  Your consent will be active for this visit and any virtual visit you may have with one of our providers in the next 365 days.     If you have a MyChart account, a copy of this consent can be sent to you electronically.  All virtual visits are billed to your insurance company just like a traditional visit in the office.    As this is a virtual visit, video technology does not allow for your provider to perform a traditional examination.  This may limit your provider's ability to fully assess your condition.  If your provider identifies any concerns that need to be evaluated in person or the need to arrange testing (such as labs, EKG, etc.), we will make arrangements to do so.     Although advances in technology are sophisticated, we cannot ensure that it will always work on either your end or our end.  If the connection with a video visit is poor, the visit may have to be switched to a telephone visit.  With either a video or telephone visit, we are not always able to ensure that we have a secure connection.     I need to obtain your verbal consent now.   Are you willing to proceed with your visit today?    Kaeleen Odom Dobrowolski has provided verbal consent on 01/30/2021 for a virtual visit (video or telephone).   Margaretann Loveless, PA-C   Date: 01/30/2021 4:02 PM   Virtual Visit via Video Note   I, Margaretann Loveless, connected with  KATALEAH BEJAR  ([id number], 18-Apr-1981) on 01/30/21 at  4:00 PM EDT by a video-enabled telemedicine application and verified that I am speaking with the correct person using two identifiers.  Location: Patient: Virtual Visit Location Patient: Home Provider: Virtual Visit Location Provider: Home Office   I discussed the limitations of evaluation and  management by telemedicine and the availability of in person appointments. The patient expressed understanding and agreed to proceed.    History of Present Illness: ROSMARY DIONISIO is a 40 y.o. who identifies as a female who was assigned female at birth, and is being seen today for ear pain, possible infection.  HPI: Otalgia  There is pain in the left (right ear starting as well, but left is worse) ear. This is a new problem. The current episode started more than 1 month ago. The problem occurs constantly. The problem has been gradually worsening. There has been no fever. Associated symptoms include coughing, ear discharge (left), headaches, hearing loss (muffled hearing; feels like she needs to pop them) and rhinorrhea. Pertinent negatives include no sore throat. Treatments tried: sudafed, mucinex, severe sinus congestion, throat lozenges. The treatment provided no relief. There is no history of a chronic ear infection, hearing loss or a tympanostomy tube.     Problems:  Patient Active Problem List   Diagnosis Date Noted   History of PID 02/26/2019   Hypertensive emergency 02/26/2019   PID (acute pelvic inflammatory disease) 01/19/2019    Allergies: No Known Allergies Medications:  Current Outpatient Medications:    amLODipine (NORVASC) 10 MG tablet, Take 1 tablet (10 mg total) by mouth daily., Disp: 30 tablet, Rfl: 0   cyclobenzaprine (FLEXERIL) 10 MG tablet, Take 1 tablet (10  mg total) by mouth 2 (two) times daily as needed for muscle spasms., Disp: 12 tablet, Rfl: 0   ondansetron (ZOFRAN ODT) 4 MG disintegrating tablet, Take 1 tablet (4 mg total) by mouth every 8 (eight) hours as needed for nausea., Disp: 10 tablet, Rfl: 0  Observations/Objective: Patient is well-developed, well-nourished in no acute distress.  Resting comfortably at home.  Head is normocephalic, atraumatic.  No labored breathing. Speech is clear and coherent with logical content.  Patient is alert and oriented at  baseline.    Assessment and Plan: 1. Acute bacterial sinusitis  2. Non-recurrent acute suppurative otitis media of left ear without spontaneous rupture of tympanic membrane  - Worsening symptoms that have not responded to OTC medications.  - Will give augmentin - Continue allergy medications.  - Stay well hydrated and get plenty of rest.  - Call or seek in person evaluation if no symptom improvement or if symptoms worsen.  Follow Up Instructions: I discussed the assessment and treatment plan with the patient. The patient was provided an opportunity to ask questions and all were answered. The patient agreed with the plan and demonstrated an understanding of the instructions.  A copy of instructions were sent to the patient via MyChart unless otherwise noted below.   The patient was advised to call back or seek an in-person evaluation if the symptoms worsen or if the condition fails to improve as anticipated.  Time:  I spent 9 minutes with the patient via telehealth technology discussing the above problems/concerns.    Margaretann Loveless, PA-C

## 2021-01-30 NOTE — Patient Instructions (Signed)
Debra Obrien, thank you for joining Margaretann Loveless, PA-C for today's virtual visit.  While this provider is not your primary care provider (PCP), if your PCP is located in our provider database this encounter information will be shared with them immediately following your visit.  Consent: (Patient) Debra Obrien provided verbal consent for this virtual visit at the beginning of the encounter.  Current Medications:  Current Outpatient Medications:    amoxicillin-clavulanate (AUGMENTIN) 875-125 MG tablet, Take 1 tablet by mouth 2 (two) times daily., Disp: 20 tablet, Rfl: 0   amLODipine (NORVASC) 10 MG tablet, Take 1 tablet (10 mg total) by mouth daily., Disp: 30 tablet, Rfl: 0   cyclobenzaprine (FLEXERIL) 10 MG tablet, Take 1 tablet (10 mg total) by mouth 2 (two) times daily as needed for muscle spasms., Disp: 12 tablet, Rfl: 0   ondansetron (ZOFRAN ODT) 4 MG disintegrating tablet, Take 1 tablet (4 mg total) by mouth every 8 (eight) hours as needed for nausea., Disp: 10 tablet, Rfl: 0   Medications ordered in this encounter:  Meds ordered this encounter  Medications   amoxicillin-clavulanate (AUGMENTIN) 875-125 MG tablet    Sig: Take 1 tablet by mouth 2 (two) times daily.    Dispense:  20 tablet    Refill:  0    Order Specific Question:   Supervising Provider    Answer:   Hyacinth Meeker, BRIAN [[id number]]     *If you need refills on other medications prior to your next appointment, please contact your pharmacy*  Follow-Up: Call back or seek an in-person evaluation if the symptoms worsen or if the condition fails to improve as anticipated.  Other Instructions Sinusitis, Adult Sinusitis is soreness and swelling (inflammation) of your sinuses. Sinuses are hollow spaces in the bones around your face. They are located: Around your eyes. In the middle of your forehead. Behind your nose. In your cheekbones. Your sinuses and nasal passages are lined with a fluid called mucus. Mucus drains  out of your sinuses. Swelling can trap mucus in your sinuses. This lets germs (bacteria, virus, or fungus) grow, which leads to infection. Most of the time, this condition is caused by a virus. What are the causes? This condition is caused by: Allergies. Asthma. Germs. Things that block your nose or sinuses. Growths in the nose (nasal polyps). Chemicals or irritants in the air. Fungus (rare). What increases the risk? You are more likely to develop this condition if: You have a weak body defense system (immune system). You do a lot of swimming or diving. You use nasal sprays too much. You smoke. What are the signs or symptoms? The main symptoms of this condition are pain and a feeling of pressure around the sinuses. Other symptoms include: Stuffy nose (congestion). Runny nose (drainage). Swelling and warmth in the sinuses. Headache. Toothache. A cough that may get worse at night. Mucus that collects in the throat or the back of the nose (postnasal drip). Being unable to smell and taste. Being very tired (fatigue). A fever. Sore throat. Bad breath. How is this diagnosed? This condition is diagnosed based on: Your symptoms. Your medical history. A physical exam. Tests to find out if your condition is short-term (acute) or long-term (chronic). Your doctor may: Check your nose for growths (polyps). Check your sinuses using a tool that has a light (endoscope). Check for allergies or germs. Do imaging tests, such as an MRI or CT scan. How is this treated? Treatment for this condition depends on the cause  and whether it is short-term or long-term. If caused by a virus, your symptoms should go away on their own within 10 days. You may be given medicines to relieve symptoms. They include: Medicines that shrink swollen tissue in the nose. Medicines that treat allergies (antihistamines). A spray that treats swelling of the nostrils.  Rinses that help get rid of thick mucus in your  nose (nasal saline washes). If caused by bacteria, your doctor may wait to see if you will get better without treatment. You may be given antibiotic medicine if you have: A very bad infection. A weak body defense system. If caused by growths in the nose, you may need to have surgery. Follow these instructions at home: Medicines Take, use, or apply over-the-counter and prescription medicines only as told by your doctor. These may include nasal sprays. If you were prescribed an antibiotic medicine, take it as told by your doctor. Do not stop taking the antibiotic even if you start to feel better. Hydrate and humidify  Drink enough water to keep your pee (urine) pale yellow. Use a cool mist humidifier to keep the humidity level in your home above 50%. Breathe in steam for 10-15 minutes, 3-4 times a day, or as told by your doctor. You can do this in the bathroom while a hot shower is running. Try not to spend time in cool or dry air. Rest Rest as much as you can. Sleep with your head raised (elevated). Make sure you get enough sleep each night. General instructions  Put a warm, moist washcloth on your face 3-4 times a day, or as often as told by your doctor. This will help with discomfort. Wash your hands often with soap and water. If there is no soap and water, use hand sanitizer. Do not smoke. Avoid being around people who are smoking (secondhand smoke). Keep all follow-up visits as told by your doctor. This is important. Contact a doctor if: You have a fever. Your symptoms get worse. Your symptoms do not get better within 10 days. Get help right away if: You have a very bad headache. You cannot stop throwing up (vomiting). You have very bad pain or swelling around your face or eyes. You have trouble seeing. You feel confused. Your neck is stiff. You have trouble breathing. Summary Sinusitis is swelling of your sinuses. Sinuses are hollow spaces in the bones around your  face. This condition is caused by tissues in your nose that become inflamed or swollen. This traps germs. These can lead to infection. If you were prescribed an antibiotic medicine, take it as told by your doctor. Do not stop taking it even if you start to feel better. Keep all follow-up visits as told by your doctor. This is important. This information is not intended to replace advice given to you by your health care provider. Make sure you discuss any questions you have with your health care provider. Document Revised: 09/16/2017 Document Reviewed: 09/16/2017 Elsevier Patient Education  2022 [employer].    If you have been instructed to have an in-person evaluation today at a local Urgent Care facility, please use the link below. It will take you to a list of all of our available [facility], including address, phone number and hours of operation. Please do not delay care.  [facility]  If you or a family member do not have a primary care provider, use the link below to schedule a visit and establish care. When you  choose a [facility] primary care physician or advanced practice provider, you gain a long-term partner in health. Find a Primary Care Provider  Learn more about [facility]'s in-office and virtual care options: [facility]

## 2021-06-10 ENCOUNTER — Ambulatory Visit
Admission: RE | Admit: 2021-06-10 | Discharge: 2021-06-10 | Disposition: A | Discharge status: Other Acute Inpatient Hospital | Payer: Self-pay | Source: Ambulatory Visit | Attending: Internal Medicine | Admitting: Internal Medicine

## 2021-06-10 ENCOUNTER — Other Ambulatory Visit: Payer: Self-pay

## 2021-06-10 ENCOUNTER — Inpatient Hospital Stay (HOSPITAL_COMMUNITY)
Admission: AD | Admit: 2021-06-10 | Discharge: 2021-06-10 | Discharge status: Against Medical Advice | Payer: Medicaid Other | Attending: Obstetrics and Gynecology | Admitting: Obstetrics and Gynecology

## 2021-06-10 VITALS — BP 175/111 | HR 85 | Temp 98.3°F | Resp 16

## 2021-06-10 DIAGNOSIS — Z3202 Encounter for pregnancy test, result negative: Secondary | ICD-10-CM | POA: Insufficient documentation

## 2021-06-10 DIAGNOSIS — I160 Hypertensive urgency: Secondary | ICD-10-CM | POA: Diagnosis not present

## 2021-06-10 DIAGNOSIS — Z5329 Procedure and treatment not carried out because of patient's decision for other reasons: Secondary | ICD-10-CM | POA: Insufficient documentation

## 2021-06-10 DIAGNOSIS — N939 Abnormal uterine and vaginal bleeding, unspecified: Secondary | ICD-10-CM

## 2021-06-10 LAB — POCT URINE PREGNANCY: Preg Test, Ur: NEGATIVE

## 2021-06-10 NOTE — ED Provider Notes (Signed)
[facility]    CSN: [id number] Arrival date & time: 06/10/21  0856      History   Chief Complaint Chief Complaint  Patient presents with   Abdominal Pain    HPI Debra Obrien is a 41 y.o. female.   Patient presents with vaginal bleeding that started yesterday.  Patient reports that she started having severe cramping in the lower abdomen and vaginal bleeding subsequently started after that.  She reports heavy vaginal bleeding with large clots and is having to change her tampon and pad at least every hour if not sooner.  Denies any associated dizziness, blurred vision, nausea, vomiting.  She reports that she had a period on 05/20/2021 that lasted approximately 3 days, which is abnormal for her as her periods typically last 7 days exactly.  Denies any known exposure to STD but has had unprotected sexual intercourse with her sexual partner who she believes has multiple other sexual partners.  Denies any vaginal discharge, urinary burning, urinary frequency, fever, pelvic pain, back pain.   Abdominal Pain  Past Medical History:  Diagnosis Date   Hypertension     Patient Active Problem List   Diagnosis Date Noted   History of PID 02/26/2019   Hypertensive emergency 02/26/2019   PID (acute pelvic inflammatory disease) 01/19/2019    History reviewed. No pertinent surgical history.  OB History     Gravida  6   Para  3   Term  2   Preterm  1   AB  2   Living  3      SAB      IAB  2   Ectopic      Multiple      Live Births               Home Medications    Prior to Admission medications   Medication Sig Start Date End Date Taking? Authorizing Provider  amLODipine (NORVASC) 10 MG tablet Take 1 tablet (10 mg total) by mouth daily. 05/07/20   Fayrene Helper, PA-C  amoxicillin-clavulanate (AUGMENTIN) 875-125 MG tablet Take 1 tablet by mouth 2 (two) times daily. 01/30/21   Margaretann Loveless, PA-C  cyclobenzaprine (FLEXERIL) 10 MG tablet Take 1  tablet (10 mg total) by mouth 2 (two) times daily as needed for muscle spasms. 06/07/19   Jeannie Fend, PA-C  ondansetron (ZOFRAN ODT) 4 MG disintegrating tablet Take 1 tablet (4 mg total) by mouth every 8 (eight) hours as needed for nausea. 05/29/19   Eber Hong, MD  lisinopril (ZESTRIL) 10 MG tablet TAKE 1 TABLET BY MOUTH DAILY Patient not taking: Reported on 02/09/2019 01/23/19 05/07/20  Hermina Staggers, MD    Family History Family History  Problem Relation Age of Onset   Diabetes Father     Social History Social History   Tobacco Use   Smoking status: Every Day    Packs/day: 1.00    Types: Cigarettes   Smokeless tobacco: Never  Vaping Use   Vaping Use: Never used  Substance Use Topics   Alcohol use: Yes    Comment: 1-2 times a month.    Drug use: Yes    Types: Marijuana, Cocaine    Comment: Marijuana: Daily. Cocaine: Once every 2 weeks.      Allergies   Patient has no known allergies.   Review of Systems Review of Systems Per HPI  Physical Exam Triage Vital Signs ED Triage Vitals [06/10/21 0923]  Enc Vitals Group  BP (!) 175/111     Pulse Rate 85     Resp 16     Temp 98.3 F (36.8 C)     Temp Source Oral     SpO2 98 %     Weight      Height      Head Circumference      Peak Flow      Pain Score 5     Pain Loc      Pain Edu?      Excl. in [place]?    No data found.  Updated Vital Signs BP (!) 175/111 (BP Location: Left Arm)    Pulse 85    Temp 98.3 F (36.8 C) (Oral)    Resp 16    SpO2 98%   Visual Acuity Right Eye Distance:   Left Eye Distance:   Bilateral Distance:    Right Eye Near:   Left Eye Near:    Bilateral Near:     Physical Exam Constitutional:      General: She is not in acute distress.    Appearance: Normal appearance. She is not toxic-appearing or diaphoretic.  HENT:     Head: Normocephalic and atraumatic.  Eyes:     Extraocular Movements: Extraocular movements intact.     Conjunctiva/sclera: Conjunctivae normal.   Cardiovascular:     Rate and Rhythm: Normal rate and regular rhythm.     Pulses: Normal pulses.     Heart sounds: Normal heart sounds.  Pulmonary:     Effort: Pulmonary effort is normal.     Breath sounds: Normal breath sounds.  Abdominal:     General: Bowel sounds are normal. There is no distension.     Palpations: Abdomen is soft.     Tenderness: There is no abdominal tenderness.  Neurological:     General: No focal deficit present.     Mental Status: She is alert and oriented to person, place, and time. Mental status is at baseline.  Psychiatric:        Mood and Affect: Mood normal.        Behavior: Behavior normal.        Thought Content: Thought content normal.        Judgment: Judgment normal.     UC Treatments / Results  Labs (all labs ordered are listed, but only abnormal results are displayed) Labs Reviewed  POCT URINE PREGNANCY    EKG   Radiology No results found.  Procedures Procedures (including critical care time)  Medications Ordered in UC Medications - No data to display  Initial Impression / Assessment and Plan / UC Course  I have reviewed the triage vital signs and the nursing notes.  Pertinent labs & imaging results that were available during my care of the patient were reviewed by me and considered in my medical decision making (see chart for details).     Differential diagnoses include gynecological complications, STD, pregnancy/miscarriage.  Urine pregnancy was negative.  It is very concerning the amount of bleeding that patient is having.  I do think that patient needs stat blood work and a more extensive evaluation than can be provided at the urgent care.  Advised patient to go to the hospital for further evaluation and management.  Vital signs stable at discharge.  Agree with patient self transport to the hospital. Final Clinical Impressions(s) / UC Diagnoses   Final diagnoses:  Vaginal bleeding, abnormal  Urine pregnancy test negative      Discharge Instructions  Please go to the emergency department as soon as you leave urgent care for further evaluation and management.    ED Prescriptions   None    PDMP not reviewed this encounter.   Teodora Medici, [place] 06/10/21 1000

## 2021-06-10 NOTE — MAU Note (Signed)
Last month period came on a wk late, was real light and only 3 days.  This month came on a wk early.  Was cramping really bad prior to bleeding started. Was seen this morning, tested her urine, was told it was negative and she needed to go to [facility].  Explained to pt, it was a negative preg. Test, and we do not see non-pregnant patients.  Pt confirmed she had not done a test at home.  Explained to pt, that as long as she is sexually active and having periods, she can get pregnant, the irregular cycles is fairly common with peri-menopause.

## 2021-06-10 NOTE — MAU Note (Signed)
Plan explained to pt by Nugent NP.   Pt does not want to go to the ER.  Attempted to give GYN doc resources, stressed importance of getting BP controlled.  Pt said not interested and walked out.

## 2021-06-10 NOTE — Discharge Instructions (Signed)
Please go to the emergency department as soon as you leave urgent care for further evaluation and management. ?

## 2021-06-10 NOTE — MAU Provider Note (Addendum)
Event Date/Time   First Provider Initiated Contact with Patient 06/10/21 1036      S Debra Obrien is a 41 y.o. [id number] patient who presents to MAU today with complaint of irregular vaginal bleeding and cramping. Patient was seen in Urgent Care earlier this morning with a negative pregnancy test and reports she was sent here by Urgent Care for further evaluation, despite her blood pressure being 175/111 and her negative pregnancy test. Patient told RN that she does know she has a diagnosis of high blood pressure but has not taken any medication for it in over a year.  O BP (!) 198/107 (BP Location: Right Arm) Comment: has not taken BP med in almost a year   Pulse 82    Temp 98.5 F (36.9 C) (Oral)    Resp 18    Ht 5\' 9"  (1.753 m)    Wt 122.7 kg    LMP 06/08/2021    SpO2 99%    BMI 39.95 kg/m   Patient Vitals for the past 24 hrs:  BP Temp Temp src Pulse Resp SpO2 Height Weight  06/10/21 1030 (!) 198/107 98.5 F (36.9 C) Oral 82 18 99 % 5\' 9"  (1.753 m) 122.7 kg   Physical Exam Vitals and nursing note reviewed.  Constitutional:      General: She is not in acute distress.    Appearance: Normal appearance. She is not ill-appearing, toxic-appearing or diaphoretic.  HENT:     Head: Normocephalic and atraumatic.  Pulmonary:     Effort: Pulmonary effort is normal.  Neurological:     Mental Status: She is alert and oriented to person, place, and time.  Psychiatric:        Mood and Affect: Mood normal.        Behavior: Behavior normal.        Thought Content: Thought content normal.        Judgment: Judgment normal.   A Medical screening exam started Hypertensive urgency UPT negative  P Report called to Dr. 08/08/21 who accepts patient in transfer. Patient medically screened and does not wish to go to ED at this time. Patient states she does not want to have to go to a third location for treatment today and has wasted money and gas driving around to places this morning. Provider  told patient that she was very concerned about patients high blood pressure and attempted to tell the patient why there was concern, but the patient said she did not want to discuss it and that she would be leaving. Patient said, "This is why I don't do doctors." Provider attempted to give patient a list of resources for ED/[facility] f/u, but patient declines. Patient did let provider discuss warning signs of hypertensive emergency and encouraged patient to seek care in this case. Patient left AMA.  , NP 06/10/2021 10:57 AM

## 2021-06-10 NOTE — ED Triage Notes (Signed)
Patient started period yesterday. Reports new/unusual/excessive pain/cramping with the onset of her period. Reports having been in the fetal position for the majority of the day yesterday. Denies change in bowel/bladder habits, new vaginal discharge, dysuria, fever. Concerned for possible STD

## 2021-08-24 IMAGING — CT CT HEAD W/O CM
4 series · 16 of 47 positions shown, 18 images · non-contrast
Comparison: None.

CLINICAL DATA: Floaters in LEFT eye.

EXAM:
CT HEAD WITHOUT CONTRAST
TECHNIQUE: Contiguous axial images were obtained from the base of the skull
through the vertex without intravenous contrast.

[Series 3: head without · axial · non-contrast · 0.46mm/px · z∈[-116,+4]mm · 7 of 32 slices shown, 9 images]
[im 4/32  brain]
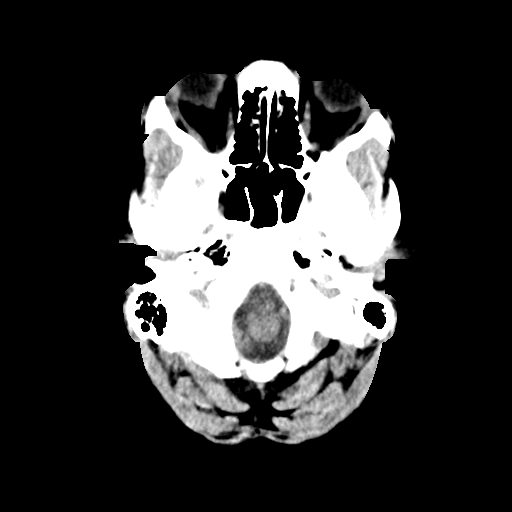
[im 4/32  bone]
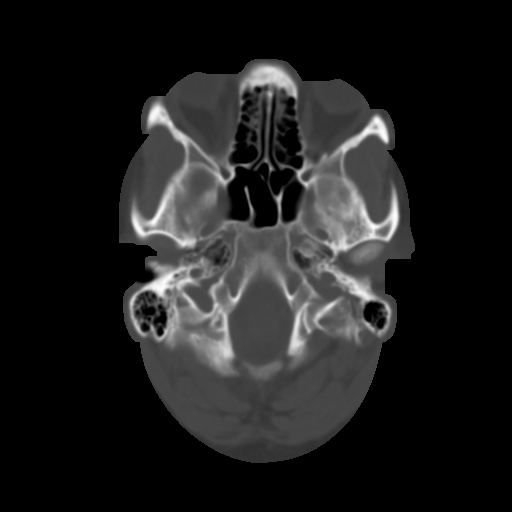
[im 8/32  brain]
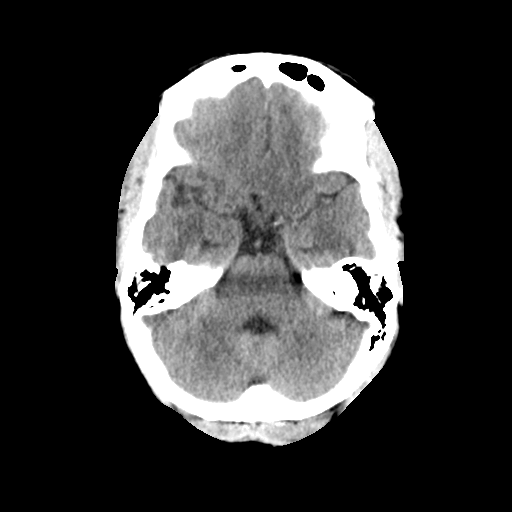
[im 12/32  brain]
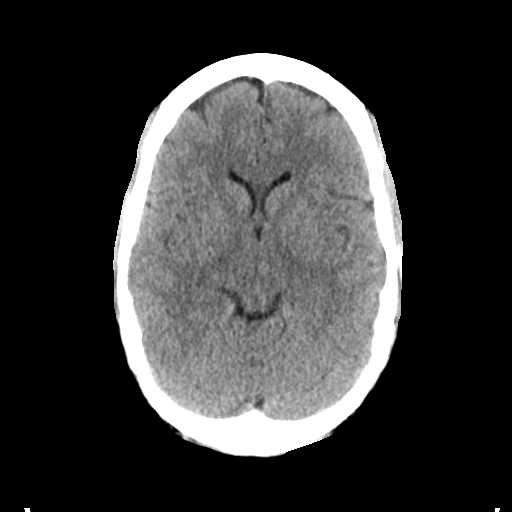
[im 16/32  brain]
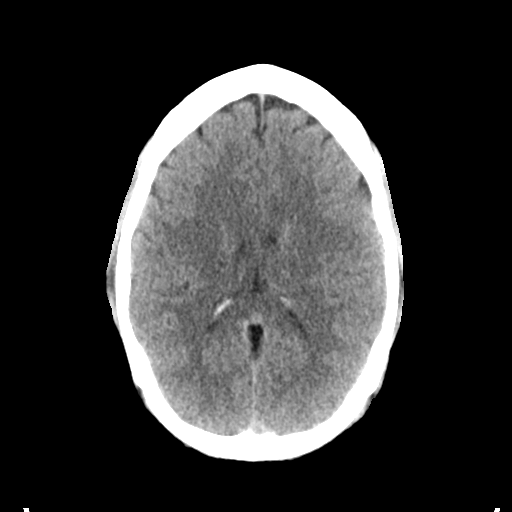
[im 20/32  brain]
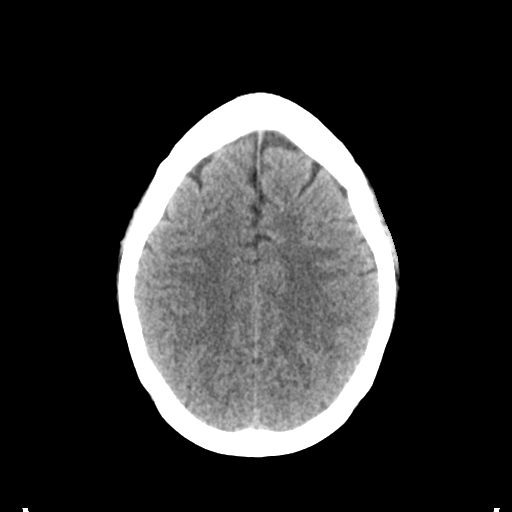
[im 20/32  bone]
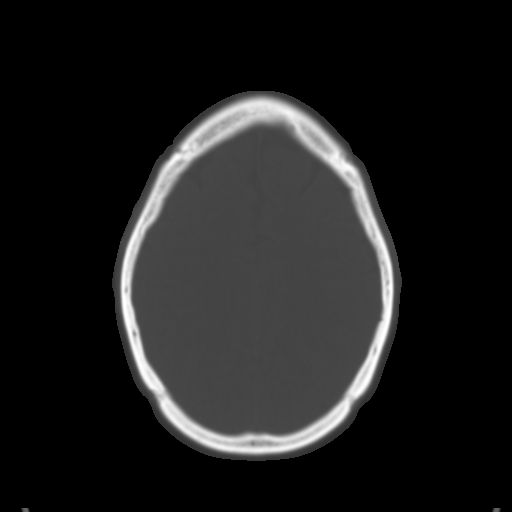
[im 24/32  brain]
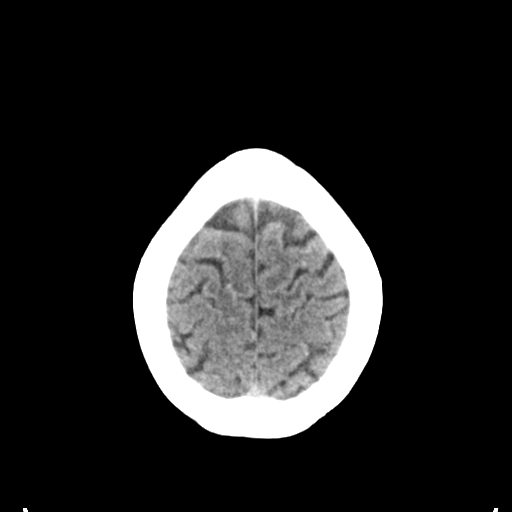
[im 28/32  brain]
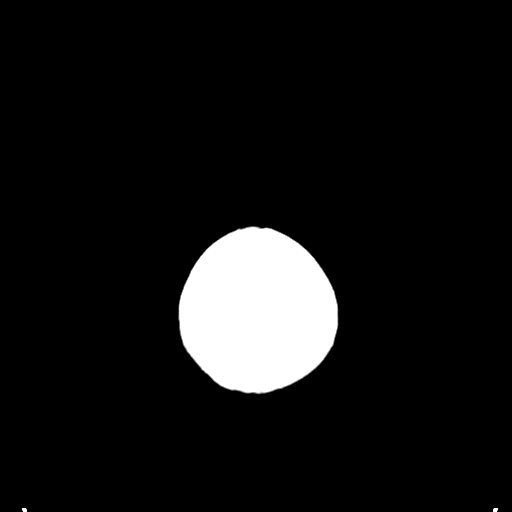

[Series 4: head bone · axial · 0.46mm/px · z∈[-117,-85]mm · 3 of 79 slices shown]
[im 8/79  bone]
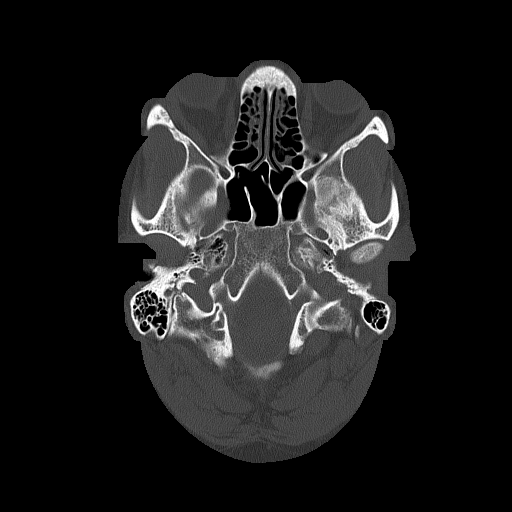
[im 16/79  bone]
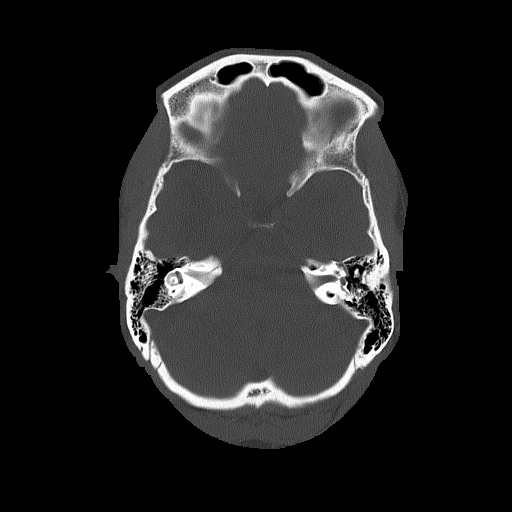
[im 24/79  bone]
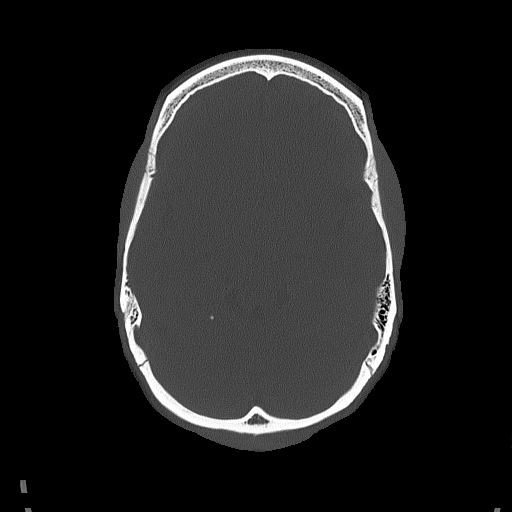

[Series 5: head without cor · coronal · non-contrast · 0.31mm/px · 3 of 70 slices shown]
[im 24/70  brain]
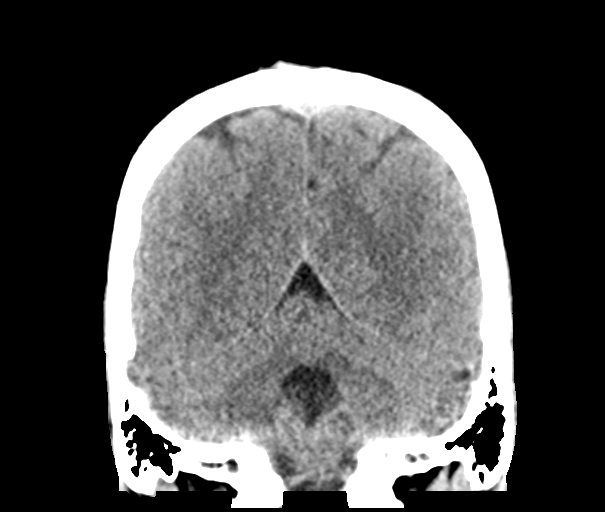
[im 31/70  brain]
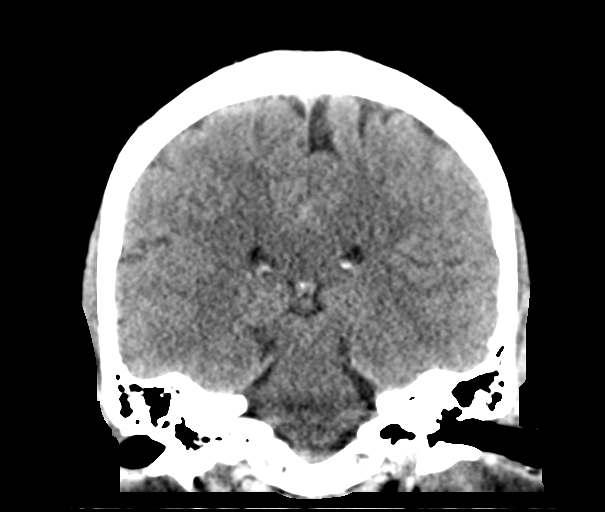
[im 39/70  brain]
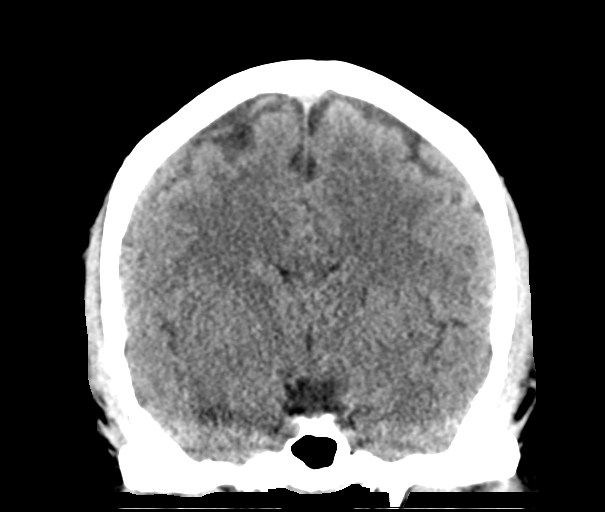

[Series 6: head without sag · sagittal · non-contrast · 0.31mm/px · 3 of 61 slices shown]
[im 21/61  brain]
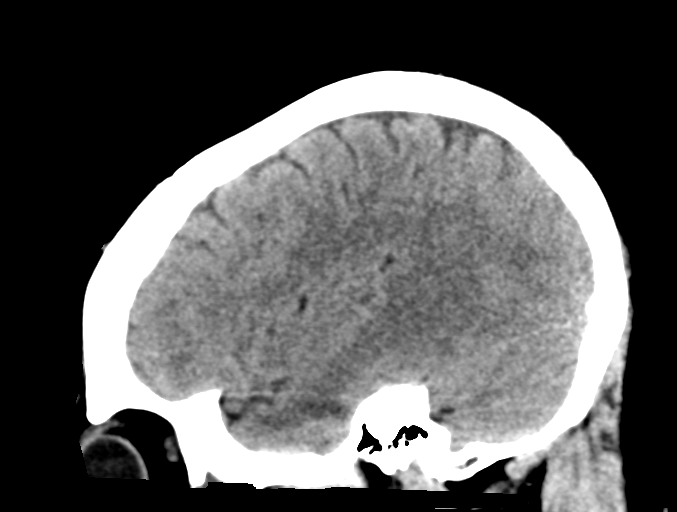
[im 31/61  brain]
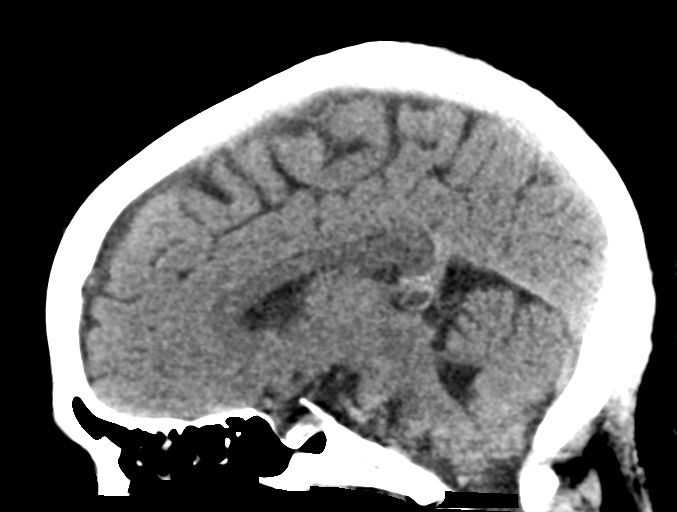
[im 41/61  brain]
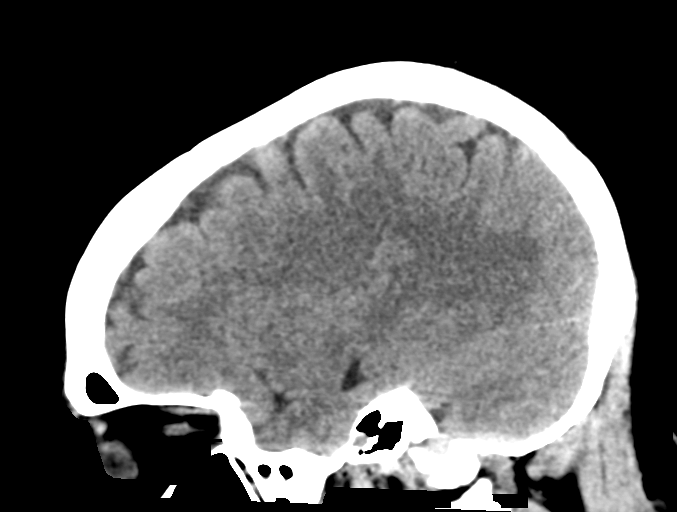

[16 of 47 positions shown; findings below may reference images not displayed]

FINDINGS: Brain: No evidence of acute infarction, hemorrhage, hydrocephalus,
extra-axial collection or mass lesion/mass effect. Unchanged RIGHT
tentorial calcification.

Vascular: No hyperdense vessel or unexpected calcification.

Skull: Normal. Negative for fracture or focal lesion.

Sinuses/Orbits: No acute finding.

Other: None.
IMPRESSION: No acute intracranial abnormality.

## 2021-08-24 IMAGING — MR MR HEAD W/O CM
4 series · 48 of 48 positions shown · non-contrast
Comparison: None.

CLINICAL DATA: Left-sided heaviness

EXAM:
MRI HEAD WITHOUT CONTRAST
TECHNIQUE: Multiplanar, multiecho pulse sequences of the brain and surrounding
structures were obtained without intravenous contrast.

[Series 5: DWI · axial · 3.0mm · 0.88mm/px · z∈[-141,-3]mm · 19 of 96 slices shown (1 of 4)]
[im 1/96]
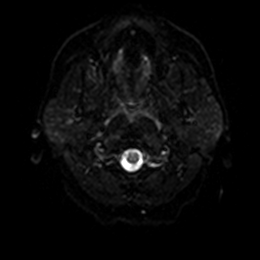
[im 6/96]
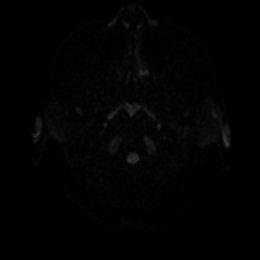
[im 11/96]
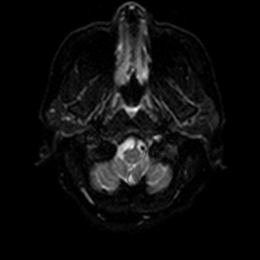
[im 16/96]
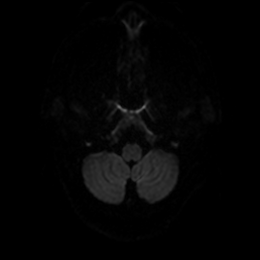
[im 22/96]
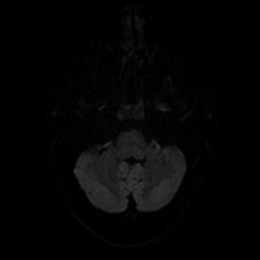
[im 27/96]
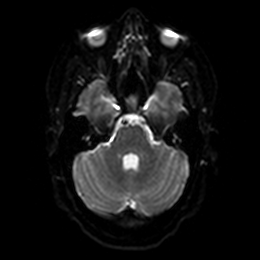
[im 32/96]
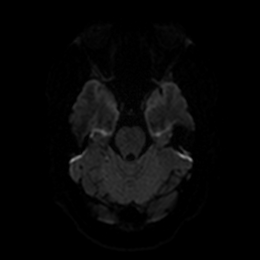
[im 37/96]
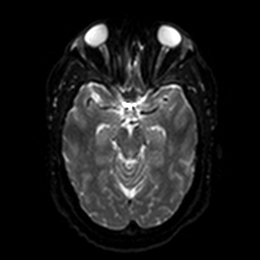
[im 43/96]
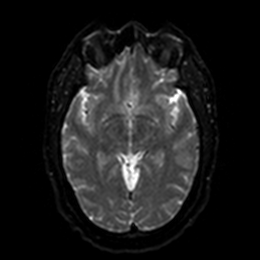
[im 48/96]
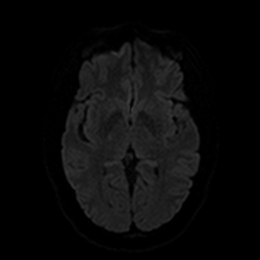
[im 53/96]
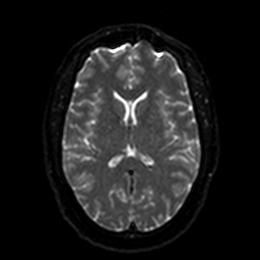
[im 59/96]
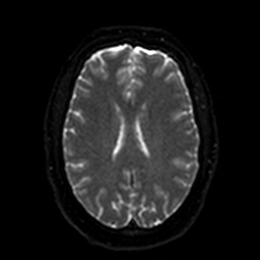
[im 64/96]
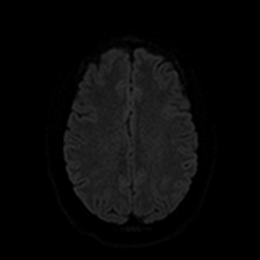
[im 69/96]
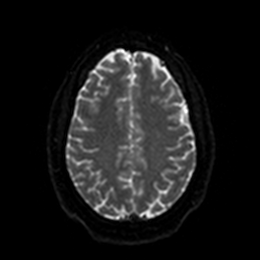
[im 74/96]
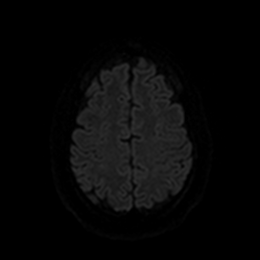
[im 80/96]
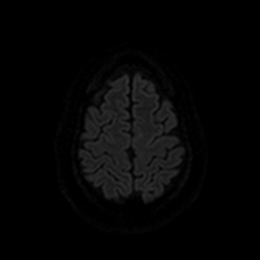
[im 85/96]
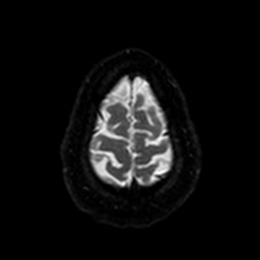
[im 90/96]
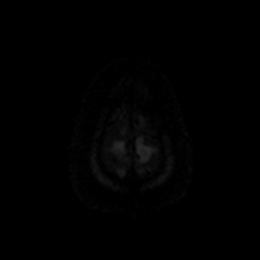
[im 96/96]
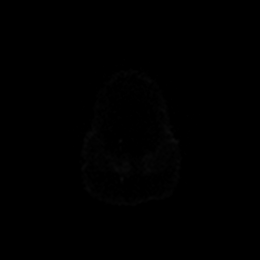

[Series 6: DWI · axial · 3.0mm · 0.88mm/px · z∈[-141,-3]mm · 9 of 48 slices shown (2 of 4)]
[im 1/48]
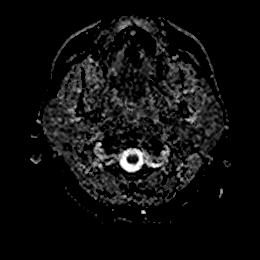
[im 6/48]
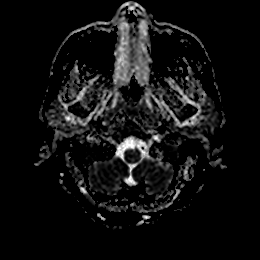
[im 12/48]
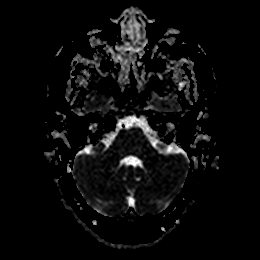
[im 18/48]
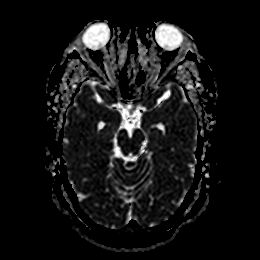
[im 24/48]
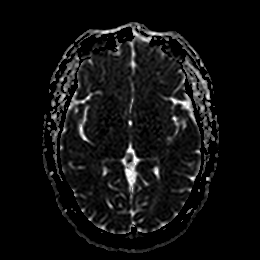
[im 30/48]
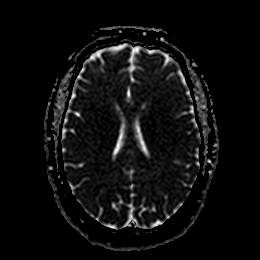
[im 36/48]
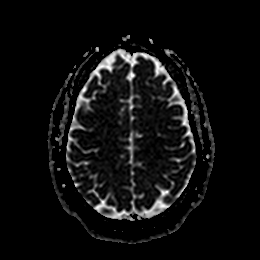
[im 42/48]
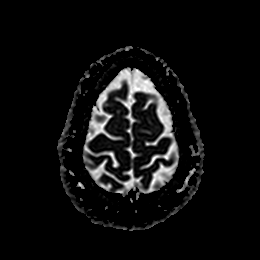
[im 48/48]
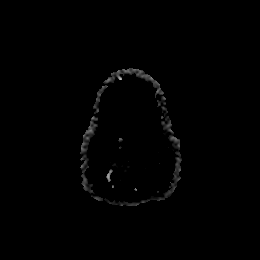

[Series 7: DWI · coronal · 4.0mm · 0.88mm/px · 13 of 68 slices shown (3 of 4)]
[im 1/68]
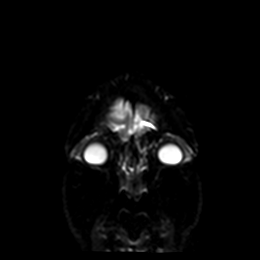
[im 6/68]
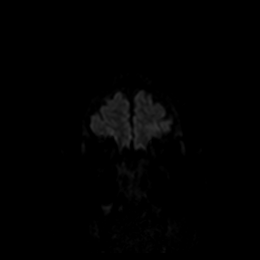
[im 12/68]
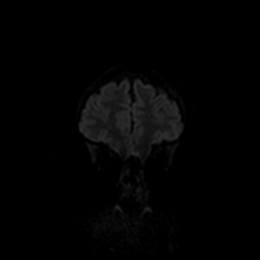
[im 17/68]
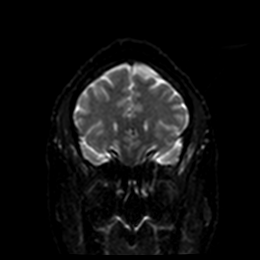
[im 23/68]
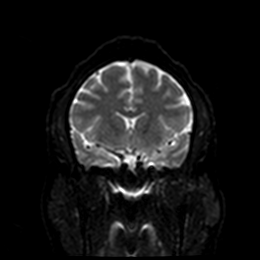
[im 28/68]
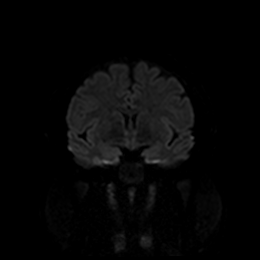
[im 34/68]
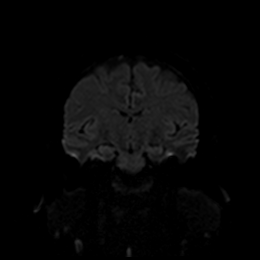
[im 40/68]
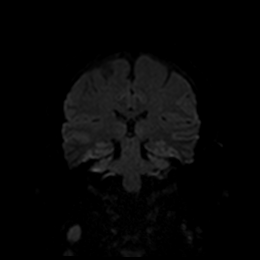
[im 45/68]
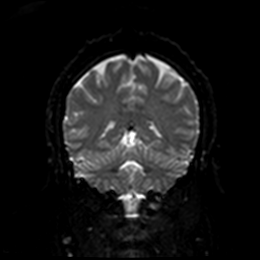
[im 51/68]
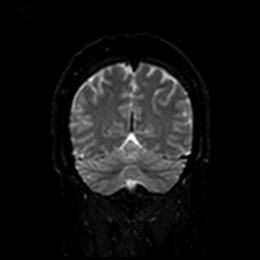
[im 56/68]
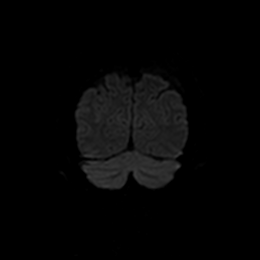
[im 62/68]
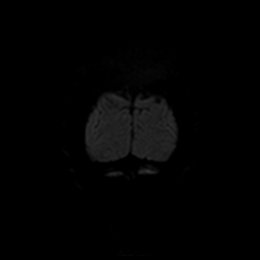
[im 68/68]
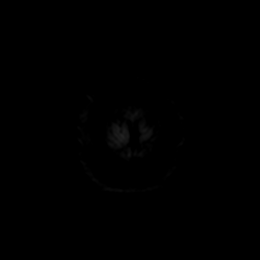

[Series 8: DWI · coronal · 4.0mm · 0.88mm/px · 7 of 34 slices shown (4 of 4)]
[im 1/34]
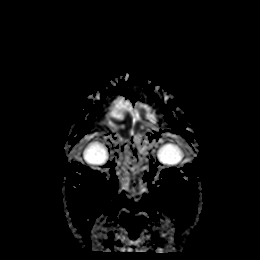
[im 6/34]
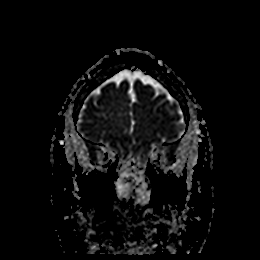
[im 12/34]
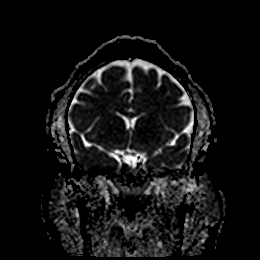
[im 17/34]
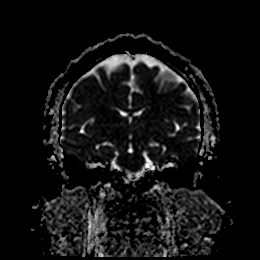
[im 23/34]
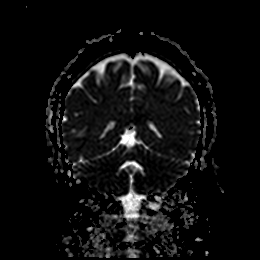
[im 28/34]
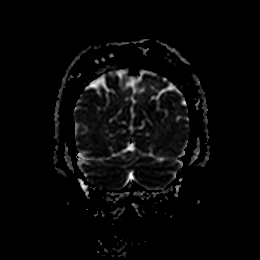
[im 34/34]
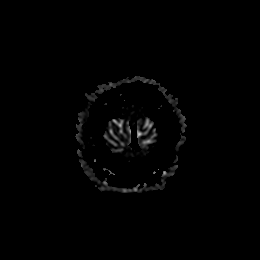

[48 of 48 positions shown; findings below may reference images not displayed]

FINDINGS: Axial and coronal DWI sequences obtained. Patient could not continue
remainder study due to claustrophobia.

Acquired images are diagnostic. There is no acute infarction. No
mass effect. No hydrocephalus. No significant edema.
IMPRESSION: Partial study due to claustrophobia.  No acute infarction.

## 2022-12-21 ENCOUNTER — Ambulatory Visit (HOSPITAL_COMMUNITY)
Admission: RE | Admit: 2022-12-21 | Discharge: 2022-12-21 | Disposition: A | Discharge status: Home / Self Care | Payer: Medicaid Other | Source: Ambulatory Visit | Attending: Emergency Medicine | Admitting: Emergency Medicine

## 2022-12-21 ENCOUNTER — Encounter (HOSPITAL_COMMUNITY): Payer: Self-pay

## 2022-12-21 VITALS — BP 171/99 | HR 103 | Temp 98.0°F | Resp 18

## 2022-12-21 DIAGNOSIS — J019 Acute sinusitis, unspecified: Secondary | ICD-10-CM

## 2022-12-21 DIAGNOSIS — J3489 Other specified disorders of nose and nasal sinuses: Secondary | ICD-10-CM

## 2022-12-21 MED ORDER — AMOXICILLIN-POT CLAVULANATE 875-125 MG PO TABS: 1.0000 | ORAL_TABLET | Freq: Two times a day (BID) | ORAL | 0 refills | Status: DC

## 2022-12-21 MED ORDER — IBUPROFEN 800 MG PO TABS: 800.0000 mg | ORAL_TABLET | Freq: Three times a day (TID) | ORAL | 0 refills | Status: DC

## 2022-12-21 NOTE — ED Provider Notes (Signed)
[facility]    CSN: [id number] Arrival date & time: 12/21/22  1351     History   Chief Complaint Chief Complaint  Patient presents with   Facial Pain    Bump inside my nose has caused my left side of face to swell including eye - Entered by patient    HPI Debra Obrien is a 42 y.o. female.  1 week history of tender bump in the left nostril Last night it became more swollen and tender, causing facial swelling and redness. There was a little drainage. Has applied ice Has not taken any medications   Denies history of this. Denies nasal trauma or injury. No intranasal drugs No fevers  Reports she "stays congested"  Past Medical History:  Diagnosis Date   Hypertension     Patient Active Problem List   Diagnosis Date Noted   History of PID 02/26/2019   Hypertensive emergency 02/26/2019   PID (acute pelvic inflammatory disease) 01/19/2019    History reviewed. No pertinent surgical history.  OB History     Gravida  6   Para  3   Term  2   Preterm  1   AB  2   Living  3      SAB      IAB  2   Ectopic      Multiple      Live Births               Home Medications    Prior to Admission medications   Medication Sig Start Date End Date Taking? Authorizing Provider  amoxicillin-clavulanate (AUGMENTIN) 875-125 MG tablet Take 1 tablet by mouth every 12 (twelve) hours. 12/21/22  Yes Roberto Hlavaty, Lurena Joiner, PA-C  ibuprofen (ADVIL) 800 MG tablet Take 1 tablet (800 mg total) by mouth 3 (three) times daily. 12/21/22  Yes Kasin Tonkinson, Lurena Joiner, PA-C  lisinopril (ZESTRIL) 10 MG tablet TAKE 1 TABLET BY MOUTH DAILY Patient not taking: Reported on 02/09/2019 01/23/19 05/07/20  Hermina Staggers, MD    Family History Family History  Problem Relation Age of Onset   Diabetes Father     Social History Social History   Tobacco Use   Smoking status: Every Day    Current packs/day: 1.00    Types: Cigarettes   Smokeless tobacco: Never  Vaping Use   Vaping  status: Never Used  Substance Use Topics   Alcohol use: Yes    Comment: 1-2 times a month.    Drug use: Yes    Types: Marijuana, Cocaine    Comment: Marijuana: Daily. Cocaine: Once every 2 weeks.      Allergies   Patient has no known allergies.   Review of Systems Review of Systems As per HPI  Physical Exam Triage Vital Signs ED Triage Vitals  Encounter Vitals Group     BP 12/21/22 1415 (!) 171/99     Systolic BP Percentile --      Diastolic BP Percentile --      Pulse Rate 12/21/22 1415 (!) 103     Resp 12/21/22 1415 18     Temp 12/21/22 1415 98 F (36.7 C)     Temp Source 12/21/22 1415 Oral     SpO2 12/21/22 1415 97 %     Weight --      Height --      Head Circumference --      Peak Flow --      Pain Score 12/21/22 1412 10  Pain Loc --      Pain Education --      Exclude from Growth Chart --    No data found.  Updated Vital Signs BP (!) 171/99 (BP Location: Left Arm)   Pulse (!) 103   Temp 98 F (36.7 C) (Oral)   Resp 18   LMP 12/21/2022 (Exact Date)   SpO2 97%   Physical Exam Vitals and nursing note reviewed.  Constitutional:      General: She is not in acute distress.    Appearance: She is not ill-appearing.  HENT:     Nose: Nasal tenderness present.     Right Sinus: Maxillary sinus tenderness present.     Left Sinus: Maxillary sinus tenderness present.      Comments: External swelling of nose, erythematous and warm. There is a small bump in the left nare, tender. There is facial tenderness over maxillary and frontal sinuses.    Mouth/Throat:     Mouth: Mucous membranes are moist.     Pharynx: Oropharynx is clear.  Eyes:     Conjunctiva/sclera: Conjunctivae normal.  Cardiovascular:     Rate and Rhythm: Normal rate and regular rhythm.     Heart sounds: Normal heart sounds.  Pulmonary:     Effort: Pulmonary effort is normal.     Breath sounds: Normal breath sounds.  Musculoskeletal:     Cervical back: Normal range of motion.   Neurological:     Mental Status: She is alert and oriented to person, place, and time.    UC Treatments / Results  Labs (all labs ordered are listed, but only abnormal results are displayed) Labs Reviewed - No data to display  EKG  Radiology No results found.  Procedures Procedures (including critical care time)  Medications Ordered in UC Medications - No data to display  Initial Impression / Assessment and Plan / UC Course  I have reviewed the triage vital signs and the nursing notes.  Pertinent labs & imaging results that were available during my care of the patient were reviewed by me and considered in my medical decision making (see chart for details).  Nasal abscess or other infection, consider an underlying sinusitis as well. Will cover with augmentin BID x 7 days. No history of MRSA, no risk factors at this time although considered. Will try augmentin and ibuprofen for now with strict return and ED precautions discussed. Patient agreeable to plan, all questions answered  Final Clinical Impressions(s) / UC Diagnoses   Final diagnoses:  Infection of nose  Acute non-recurrent sinusitis, unspecified location     Discharge Instructions      Please take medication as prescribed. Take with food to avoid upset stomach. Finish all 7 days - you should not have leftover pills  Use ibuprofen in the meantime for pain and swelling Avoid touching or picking at the area  If no improvement in 3 days, or if symptoms worsen, go to the emergency department.     ED Prescriptions     Medication Sig Dispense Auth. Provider   amoxicillin-clavulanate (AUGMENTIN) 875-125 MG tablet Take 1 tablet by mouth every 12 (twelve) hours. 14 tablet Maryanne Huneycutt, PA-C   ibuprofen (ADVIL) 800 MG tablet Take 1 tablet (800 mg total) by mouth 3 (three) times daily. 21 tablet Domonique Cothran, Lurena Joiner, PA-C      PDMP not reviewed this encounter.   Marlow Baars, [place] 12/21/22 1501

## 2022-12-21 NOTE — Discharge Instructions (Addendum)
Please take medication as prescribed. Take with food to avoid upset stomach. Finish all 7 days - you should not have leftover pills  Use ibuprofen in the meantime for pain and swelling Avoid touching or picking at the area  If no improvement in 3 days, or if symptoms worsen, go to the emergency department.

## 2022-12-21 NOTE — ED Triage Notes (Signed)
Pt states she has a bump inside her left nostril x 1 week. She states it started draining last night but not enough its hurting bad. She states her face is now swelling. She has been icing the area without relief.

## 2023-02-19 ENCOUNTER — Telehealth: Payer: Medicaid Other | Admitting: Emergency Medicine

## 2023-02-19 DIAGNOSIS — L0201 Cutaneous abscess of face: Secondary | ICD-10-CM

## 2023-02-19 MED ORDER — CEPHALEXIN 500 MG PO CAPS: 500.0000 mg | ORAL_CAPSULE | Freq: Four times a day (QID) | ORAL | 0 refills | Status: AC

## 2023-02-19 NOTE — Patient Instructions (Addendum)
  Scherry Ran Whiteley, thank you for joining Cathlyn Parsons, NP for today's virtual visit.  While this provider is not your primary care provider (PCP), if your PCP is located in our provider database this encounter information will be shared with them immediately following your visit.   A [facility] MyChart account gives you access to today's visit and all your visits, tests, and labs performed at [facility] " click here if you don't have a [facility] MyChart account or go to [email or web address]  Consent: (Patient) Debra Obrien provided verbal consent for this virtual visit at the beginning of the encounter.  Current Medications:  Current Outpatient Medications:    cephALEXin (KEFLEX) 500 MG capsule, Take 1 capsule (500 mg total) by mouth 4 (four) times daily for 7 days., Disp: 28 capsule, Rfl: 0   ibuprofen (ADVIL) 800 MG tablet, Take 1 tablet (800 mg total) by mouth 3 (three) times daily., Disp: 21 tablet, Rfl: 0   Medications ordered in this encounter:  Meds ordered this encounter  Medications   cephALEXin (KEFLEX) 500 MG capsule    Sig: Take 1 capsule (500 mg total) by mouth 4 (four) times daily for 7 days.    Dispense:  28 capsule    Refill:  0     *If you need refills on other medications prior to your next appointment, please contact your pharmacy*  Follow-Up: Call back or seek an in-person evaluation if the symptoms worsen or if the condition fails to improve as anticipated.  [facility] [phone]  Other Instructions Use warm compresses on your abscess at least twice a day, more often if you can. Apply antibiotic ointment to the abscess area. If you are not getting better or if you feel it is getting worse, please be seen in person - sometimes abscess need to be drained to heal.    If you have been instructed to have an in-person evaluation today at a local Urgent Care facility, please use the link below. It will take you to a list of  all of our available [facility], including address, phone number and hours of operation. Please do not delay care.  [facility]  If you or a family member do not have a primary care provider, use the link below to schedule a visit and establish care. When you choose a [facility] primary care physician or advanced practice provider, you gain a long-term partner in health. Find a Primary Care Provider  Learn more about [facility]'s in-office and virtual care options: [facility] - Get Care Now

## 2023-02-19 NOTE — Progress Notes (Signed)
Virtual Visit Consent   Debra Obrien, you are scheduled for a virtual visit with a [facility] provider today. Just as with appointments in the office, your consent must be obtained to participate. Your consent will be active for this visit and any virtual visit you may have with one of our providers in the next 365 days. If you have a MyChart account, a copy of this consent can be sent to you electronically.  As this is a virtual visit, video technology does not allow for your provider to perform a traditional examination. This may limit your provider's ability to fully assess your condition. If your provider identifies any concerns that need to be evaluated in person or the need to arrange testing (such as labs, EKG, etc.), we will make arrangements to do so. Although advances in technology are sophisticated, we cannot ensure that it will always work on either your end or our end. If the connection with a video visit is poor, the visit may have to be switched to a telephone visit. With either a video or telephone visit, we are not always able to ensure that we have a secure connection.  By engaging in this virtual visit, you consent to the provision of healthcare and authorize for your insurance to be billed (if applicable) for the services provided during this visit. Depending on your insurance coverage, you may receive a charge related to this service.  I need to obtain your verbal consent now. Are you willing to proceed with your visit today? Debra Obrien has provided verbal consent on 02/19/2023 for a virtual visit (video or telephone). Cathlyn Parsons, NP  Date: 02/19/2023 11:26 AM  Virtual Visit via Video Note   I, Cathlyn Parsons, connected with  Debra Obrien  ([id number], 26-Aug-1980) on 02/19/23 at 11:15 AM EDT by a video-enabled telemedicine application and verified that I am speaking with the correct person using two identifiers.  Location: Patient: Virtual Visit Location  Patient: Home Provider: Virtual Visit Location Provider: Home Office   I discussed the limitations of evaluation and management by telemedicine and the availability of in person appointments. The patient expressed understanding and agreed to proceed.    History of Present Illness: Debra Obrien is a 42 y.o. who identifies as a female who was assigned female at birth, and is being seen today for abscess under nose. STarted as a small bump a week ago and "I tried to pop it". Now is much bigger, painful. "Busted open" a little last night and drained some yellow pus. No sx in or on nose itself. Has been using warm compresses. Has applied toothpaste, hydrocortisone cream, and alcohol to area with little effect.   HPI: HPI  Problems:  Patient Active Problem List   Diagnosis Date Noted   History of PID 02/26/2019   Hypertensive emergency 02/26/2019   PID (acute pelvic inflammatory disease) 01/19/2019    Allergies: No Known Allergies Medications:  Current Outpatient Medications:    cephALEXin (KEFLEX) 500 MG capsule, Take 1 capsule (500 mg total) by mouth 4 (four) times daily for 7 days., Disp: 28 capsule, Rfl: 0   ibuprofen (ADVIL) 800 MG tablet, Take 1 tablet (800 mg total) by mouth 3 (three) times daily., Disp: 21 tablet, Rfl: 0  Observations/Objective: Patient is well-developed, well-nourished in no acute distress.  Resting comfortably  at home.  Head is normocephalic, atraumatic.  No labored breathing.  Speech is clear and coherent with logical content.  Patient is  alert and oriented at baseline.  When pt palpates area, I can see what looks to be around 1 or 1.5cm diameter area under nose with swelling and central area where I can see pus under superficial skin. No surrounding erythema or swelling  Assessment and Plan: 1. Facial abscess  Will tx with keflex. If not improving with tx, pt will need to be seen in person.   Follow Up Instructions: I discussed the assessment and  treatment plan with the patient. The patient was provided an opportunity to ask questions and all were answered. The patient agreed with the plan and demonstrated an understanding of the instructions.  A copy of instructions were sent to the patient via MyChart unless otherwise noted below.   The patient was advised to call back or seek an in-person evaluation if the symptoms worsen or if the condition fails to improve as anticipated.    Cathlyn Parsons, NP

## 2023-04-17 ENCOUNTER — Telehealth: Payer: Medicaid Other | Admitting: Physician Assistant

## 2023-04-17 DIAGNOSIS — J019 Acute sinusitis, unspecified: Secondary | ICD-10-CM

## 2023-04-17 DIAGNOSIS — L03213 Periorbital cellulitis: Secondary | ICD-10-CM | POA: Diagnosis not present

## 2023-04-17 DIAGNOSIS — B9689 Other specified bacterial agents as the cause of diseases classified elsewhere: Secondary | ICD-10-CM | POA: Diagnosis not present

## 2023-04-17 MED ORDER — AMOXICILLIN-POT CLAVULANATE 875-125 MG PO TABS: 1.0000 | ORAL_TABLET | Freq: Two times a day (BID) | ORAL | 0 refills | Status: DC

## 2023-04-17 MED ORDER — FLUTICASONE PROPIONATE 50 MCG/ACT NA SUSP: 2.0000 | Freq: Every day | NASAL | 0 refills | Status: DC

## 2023-04-17 NOTE — Progress Notes (Signed)
Virtual Visit Consent   TOBIN POZO, you are scheduled for a virtual visit with a [facility] provider today. Just as with appointments in the office, your consent must be obtained to participate. Your consent will be active for this visit and any virtual visit you may have with one of our providers in the next 365 days. If you have a MyChart account, a copy of this consent can be sent to you electronically.  As this is a virtual visit, video technology does not allow for your provider to perform a traditional examination. This may limit your provider's ability to fully assess your condition. If your provider identifies any concerns that need to be evaluated in person or the need to arrange testing (such as labs, EKG, etc.), we will make arrangements to do so. Although advances in technology are sophisticated, we cannot ensure that it will always work on either your end or our end. If the connection with a video visit is poor, the visit may have to be switched to a telephone visit. With either a video or telephone visit, we are not always able to ensure that we have a secure connection.  By engaging in this virtual visit, you consent to the provision of healthcare and authorize for your insurance to be billed (if applicable) for the services provided during this visit. Depending on your insurance coverage, you may receive a charge related to this service.  I need to obtain your verbal consent now. Are you willing to proceed with your visit today? Sheindel Mans Wagster has provided verbal consent on 04/17/2023 for a virtual visit (video or telephone). Piedad Climes, [place]  Date: 04/17/2023 1:15 PM  Virtual Visit via Video Note   I, Piedad Climes, connected with  TEGRA GROEN  ([id number], 1980/05/18) on 04/17/23 at 12:45 PM EST by a video-enabled telemedicine application and verified that I am speaking with the correct person using two identifiers.  Location: Patient: Virtual Visit  Location Patient: Home Provider: Virtual Visit Location Provider: Home Office   I discussed the limitations of evaluation and management by telemedicine and the availability of in person appointments. The patient expressed understanding and agreed to proceed.    History of Present Illness: Debra Obrien is a 42 y.o. who identifies as a female who was assigned female at birth, and is being seen today for concern of sinusitis and conjunctivitis. Patient endorses some ongoing nasal congestion, sinus pressure and sinus pain with thick nasal discharge.Some eye itching. Scratched the area and noted development of redness and tenderness of the skin around this. Denies vision change. Swollen but doesn't hurt.Denies fever, chills, body aches.  HPI: HPI  Problems:  Patient Active Problem List   Diagnosis Date Noted   History of PID 02/26/2019   Hypertensive emergency 02/26/2019   PID (acute pelvic inflammatory disease) 01/19/2019    Allergies: No Known Allergies Medications:  Current Outpatient Medications:    amoxicillin-clavulanate (AUGMENTIN) 875-125 MG tablet, Take 1 tablet by mouth 2 (two) times daily., Disp: 14 tablet, Rfl: 0   fluticasone (FLONASE) 50 MCG/ACT nasal spray, Place 2 sprays into both nostrils daily., Disp: 16 g, Rfl: 0  Observations/Objective: Patient is well-developed, well-nourished in no acute distress.  Resting comfortably at home.  Head is normocephalic, atraumatic.  No labored breathing. Speech is clear and coherent with logical content.  Patient is alert and oriented at baseline.  Assessment and Plan: 1. Acute bacterial sinusitis (Primary) - fluticasone (FLONASE) 50 MCG/ACT nasal spray; Place  2 sprays into both nostrils daily.  Dispense: 16 g; Refill: 0 - amoxicillin-clavulanate (AUGMENTIN) 875-125 MG tablet; Take 1 tablet by mouth 2 (two) times daily.  Dispense: 14 tablet; Refill: 0  2. Preseptal cellulitis - amoxicillin-clavulanate (AUGMENTIN) 875-125 MG  tablet; Take 1 tablet by mouth 2 (two) times daily.  Dispense: 14 tablet; Refill: 0  Supportive measures and OTC medications reviewed. Antihistamine recommended. Continue warm compresses over the eye. Augmentin and Flonase per orders.  Strict in person evaluation reviewed.   Follow Up Instructions: I discussed the assessment and treatment plan with the patient. The patient was provided an opportunity to ask questions and all were answered. The patient agreed with the plan and demonstrated an understanding of the instructions.  A copy of instructions were sent to the patient via MyChart unless otherwise noted below.   The patient was advised to call back or seek an in-person evaluation if the symptoms worsen or if the condition fails to improve as anticipated.    Piedad Climes, PA-C

## 2023-04-17 NOTE — Patient Instructions (Signed)
Scherry Ran Panzer, thank you for joining Debra Climes, PA-C for today's virtual visit.  While this provider is not your primary care provider (PCP), if your PCP is located in our provider database this encounter information will be shared with them immediately following your visit.   A [facility] MyChart account gives you access to today's visit and all your visits, tests, and labs performed at [facility] " click here if you don't have a [facility] MyChart account or go to [email or web address]  Consent: (Patient) Debra Obrien provided verbal consent for this virtual visit at the beginning of the encounter.  Current Medications:  Current Outpatient Medications:    ibuprofen (ADVIL) 800 MG tablet, Take 1 tablet (800 mg total) by mouth 3 (three) times daily., Disp: 21 tablet, Rfl: 0   Medications ordered in this encounter:  No orders of the defined types were placed in this encounter.    *If you need refills on other medications prior to your next appointment, please contact your pharmacy*  Follow-Up: Call back or seek an in-person evaluation if the symptoms worsen or if the condition fails to improve as anticipated.  [facility] [phone]  Other Instructions Please take antibiotic as directed.  Increase fluid intake.  Use Saline nasal spray.  Take a daily multivitamin. Use the Flonase as directed. Start OTC xyzal once daily. Continue warm compresses..  Place a humidifier in the bedroom.  Please call or return clinic if symptoms are not improving.  Sinusitis Sinusitis is redness, soreness, and swelling (inflammation) of the paranasal sinuses. Paranasal sinuses are air pockets within the bones of your face (beneath the eyes, the middle of the forehead, or above the eyes). In healthy paranasal sinuses, mucus is able to drain out, and air is able to circulate through them by way of your nose. However, when your paranasal sinuses are inflamed,  mucus and air can become trapped. This can allow bacteria and other germs to grow and cause infection. Sinusitis can develop quickly and last only a short time (acute) or continue over a long period (chronic). Sinusitis that lasts for more than 12 weeks is considered chronic.  CAUSES  Causes of sinusitis include: Allergies. Structural abnormalities, such as displacement of the cartilage that separates your nostrils (deviated septum), which can decrease the air flow through your nose and sinuses and affect sinus drainage. Functional abnormalities, such as when the small hairs (cilia) that line your sinuses and help remove mucus do not work properly or are not present. SYMPTOMS  Symptoms of acute and chronic sinusitis are the same. The primary symptoms are pain and pressure around the affected sinuses. Other symptoms include: Upper toothache. Earache. Headache. Bad breath. Decreased sense of smell and taste. A cough, which worsens when you are lying flat. Fatigue. Fever. Thick drainage from your nose, which often is green and may contain pus (purulent). Swelling and warmth over the affected sinuses. DIAGNOSIS  Your caregiver will perform a physical exam. During the exam, your caregiver may: Look in your nose for signs of abnormal growths in your nostrils (nasal polyps). Tap over the affected sinus to check for signs of infection. View the inside of your sinuses (endoscopy) with a special imaging device with a light attached (endoscope), which is inserted into your sinuses. If your caregiver suspects that you have chronic sinusitis, one or more of the following tests may be recommended: Allergy tests. Nasal culture A sample of mucus is taken from your nose and sent  to a lab and screened for bacteria. Nasal cytology A sample of mucus is taken from your nose and examined by your caregiver to determine if your sinusitis is related to an allergy. TREATMENT  Most cases of acute sinusitis are  related to a viral infection and will resolve on their own within 10 days. Sometimes medicines are prescribed to help relieve symptoms (pain medicine, decongestants, nasal steroid sprays, or saline sprays).  However, for sinusitis related to a bacterial infection, your caregiver will prescribe antibiotic medicines. These are medicines that will help kill the bacteria causing the infection.  Rarely, sinusitis is caused by a fungal infection. In theses cases, your caregiver will prescribe antifungal medicine. For some cases of chronic sinusitis, surgery is needed. Generally, these are cases in which sinusitis recurs more than 3 times per year, despite other treatments. HOME CARE INSTRUCTIONS  Drink plenty of water. Water helps thin the mucus so your sinuses can drain more easily. Use a humidifier. Inhale steam 3 to 4 times a day (for example, sit in the bathroom with the shower running). Apply a warm, moist washcloth to your face 3 to 4 times a day, or as directed by your caregiver. Use saline nasal sprays to help moisten and clean your sinuses. Take over-the-counter or prescription medicines for pain, discomfort, or fever only as directed by your caregiver. SEEK IMMEDIATE MEDICAL CARE IF: You have increasing pain or severe headaches. You have nausea, vomiting, or drowsiness. You have swelling around your face. You have vision problems. You have a stiff neck. You have difficulty breathing. MAKE SURE YOU:  Understand these instructions. Will watch your condition. Will get help right away if you are not doing well or get worse. Document Released: 04/16/2005 Document Revised: 07/09/2011 Document Reviewed: 05/01/2011 [facility] Patient Information 2014 [place], [place].    If you have been instructed to have an in-person evaluation today at a local Urgent Care facility, please use the link below. It will take you to a list of all of our available [facility], including address, phone  number and hours of operation. Please do not delay care.  [facility]  If you or a family member do not have a primary care provider, use the link below to schedule a visit and establish care. When you choose a [facility] primary care physician or advanced practice provider, you gain a long-term partner in health. Find a Primary Care Provider  Learn more about [facility] in-office and virtual care options: [facility] - Get Care Now

## 2023-12-10 ENCOUNTER — Telehealth: Admitting: Family Medicine

## 2023-12-10 DIAGNOSIS — L0291 Cutaneous abscess, unspecified: Secondary | ICD-10-CM | POA: Diagnosis not present

## 2023-12-10 MED ORDER — MUPIROCIN 2 % EX OINT: 1.0000 | TOPICAL_OINTMENT | Freq: Two times a day (BID) | CUTANEOUS | 0 refills | Status: AC

## 2023-12-10 MED ORDER — CEPHALEXIN 500 MG PO CAPS: 500.0000 mg | ORAL_CAPSULE | Freq: Three times a day (TID) | ORAL | 0 refills | Status: AC

## 2023-12-10 NOTE — Patient Instructions (Signed)
 Skin Abscess  A skin abscess is an infected area on or under your skin. It contains pus and other material. An abscess may also be called a furuncle, carbuncle, or boil. It is often the result of an infection caused by bacteria. An abscess can occur in or on almost any part of your body. Sometimes, an abscess may break open (rupture) on its own. In most cases, it will keep getting worse unless it is treated. An abscess can cause pain and make you feel ill. An untreated abscess can cause infection to spread to other parts of your body or your bloodstream. The abscess may need to be drained. You may also need to take antibiotics. What are the causes? An abscess occurs when germs, like bacteria, pass through your skin and cause an infection. This may be caused by: A scrape or cut on your skin. A puncture wound through your skin, such as a needle injection or insect bite. Blocked oil or sweat glands. Blocked and infected hair follicles. A fluid-filled sac that forms beneath your skin (sebaceous cyst) and becomes infected. What increases the risk? You may be more likely to develop an abscess if: You have problems with blood circulation, or you have a weak body defense system (immune system). You have diabetes. You have dry and irritated skin. You get injections often or use IV drugs. You have a foreign body in a wound, such as a splinter. You smoke or use tobacco products. What are the signs or symptoms? Symptoms of this condition include: A painful, firm bump under the skin. A bump with pus at the top. This may break through the skin and drain. Other symptoms include: Redness and swelling around the abscess. Warmth or tenderness. Swelling of the lymph nodes (glands) near the abscess. A sore on the skin. How is this diagnosed? This condition may be diagnosed based on a physical exam and your medical history. You may also have tests done, such as: A test of a sample of pus. This may be done  to find what is causing the infection. Blood tests. Imaging tests, such as an ultrasound, CT scan, or MRI. How is this treated? A small abscess that drains on its own may not need to be treated. Treatment for larger abscesses may include: Moist heat or a heat pack applied to the area a few times a day. Incision and drainage. This is a procedure to drain the abscess. Antibiotics. For a severe abscess, you may first get antibiotics through an IV and then change to antibiotics by mouth. Follow these instructions at home: Medicines Take over-the-counter and prescription medicines only as told by your provider. If you were prescribed antibiotics, take them as told by your provider. Do not stop using the antibiotic even if you start to feel better. Abscess care  If you have an abscess that has not drained, apply heat to the affected area. Use the heat source that your provider recommends, such as a moist heat pack or a heating pad. Place a towel between your skin and the heat source. Leave the heat on for 20-30 minutes at a time. If your skin turns bright red, remove the heat right away to prevent burns. The risk of burns is higher if you cannot feel pain, heat, or cold. Follow instructions from your provider about how to take care of your abscess. Make sure you: Cover the abscess with a bandage (dressing). Wash your hands with soap and water for at least 20 seconds before  and after you change the dressing or gauze. If soap and water are not available, use hand sanitizer. Change your dressing or gauze as told by your provider. Check your abscess every day for signs of an infection that is getting worse. Check for: More redness, swelling, pain, or tenderness. More fluid or blood. Warmth. More pus or a worse smell. General instructions To avoid spreading the infection: Do not share personal care items, towels, or hot tubs with others. Avoid making skin contact with other people. Be careful  when getting rid of used dressings, wound packing, or any drainage from the abscess. Do not use any products that contain nicotine or tobacco. These products include cigarettes, chewing tobacco, and vaping devices, such as e-cigarettes. If you need help quitting, ask your provider. Do not use any creams, ointments, or liquids unless you have been told to by your provider. Contact a health care provider if: You see redness that spreads quickly or red streaks on your skin spreading away from the abscess. You have any signs of worse infection at the abscess. You vomit every time you eat or drink. You have a fever, chills, or muscle aches. The cyst or abscess returns. Get help right away if: You have severe pain. You make less pee (urine) than normal. This information is not intended to replace advice given to you by your health care provider. Make sure you discuss any questions you have with your health care provider. Document Revised: 11/29/2021 Document Reviewed: 11/29/2021 Elsevier Patient Education  2024 [employer].

## 2023-12-10 NOTE — Progress Notes (Signed)
 Virtual Visit Consent   Debra Obrien, you are scheduled for a virtual visit with a [facility] provider today. Just as with appointments in the office, your consent must be obtained to participate. Your consent will be active for this visit and any virtual visit you may have with one of our providers in the next 365 days. If you have a MyChart account, a copy of this consent can be sent to you electronically.  As this is a virtual visit, video technology does not allow for your provider to perform a traditional examination. This may limit your provider's ability to fully assess your condition. If your provider identifies any concerns that need to be evaluated in person or the need to arrange testing (such as labs, EKG, etc.), we will make arrangements to do so. Although advances in technology are sophisticated, we cannot ensure that it will always work on either your end or our end. If the connection with a video visit is poor, the visit may have to be switched to a telephone visit. With either a video or telephone visit, we are not always able to ensure that we have a secure connection.  By engaging in this virtual visit, you consent to the provision of healthcare and authorize for your insurance to be billed (if applicable) for the services provided during this visit. Depending on your insurance coverage, you may receive a charge related to this service.  I need to obtain your verbal consent now. Are you willing to proceed with your visit today? Debra Obrien has provided verbal consent on 12/10/2023 for a virtual visit (video or telephone). Loa Lamp, FNP  Date: 12/10/2023 4:42 PM   Virtual Visit via Video Note   I, Loa Lamp, connected with  Debra Obrien  ([id number], 09-Aug-1980) on 12/10/23 at  4:45 PM EDT by a video-enabled telemedicine application and verified that I am speaking with the correct person using two identifiers.  Location: Patient: Virtual Visit Location Patient:  Home Provider: Virtual Visit Location Provider: Home Office   I discussed the limitations of evaluation and management by telemedicine and the availability of in person appointments. The patient expressed understanding and agreed to proceed.    History of Present Illness: Debra Obrien is a 43 y.o. who identifies as a female who was assigned female at birth, and is being seen today for redness, swelling and warmth in distal rt nostril after scratching it. She feels it is infected and they sent her home from work today because of it. SABRA  HPI: HPI  Problems:  Patient Active Problem List   Diagnosis Date Noted   History of PID 02/26/2019   Hypertensive emergency 02/26/2019   PID (acute pelvic inflammatory disease) 01/19/2019    Allergies: No Known Allergies Medications:  Current Outpatient Medications:    cephALEXin  (KEFLEX ) 500 MG capsule, Take 1 capsule (500 mg total) by mouth 3 (three) times daily for 7 days., Disp: 21 capsule, Rfl: 0   mupirocin  ointment (BACTROBAN ) 2 %, Apply 1 Application topically 2 (two) times daily for 10 days., Disp: 20 g, Rfl: 0   amoxicillin -clavulanate (AUGMENTIN ) 875-125 MG tablet, Take 1 tablet by mouth 2 (two) times daily., Disp: 14 tablet, Rfl: 0   fluticasone  (FLONASE ) 50 MCG/ACT nasal spray, Place 2 sprays into both nostrils daily., Disp: 16 g, Rfl: 0  Observations/Objective: Patient is well-developed, well-nourished in no acute distress.  Resting comfortably  at home.  Head is normocephalic, atraumatic.  No labored breathing.  Speech  is clear and coherent with logical content.  Patient is alert and oriented at baseline.    Assessment and Plan: 1. Abscess (Primary)  Keep, clean and dry, area of swelling the size of a pea in distal rt nostril. Apply mupirocin . UC if sx persist or worsen.   Follow Up Instructions: I discussed the assessment and treatment plan with the patient. The patient was provided an opportunity to ask questions and all were  answered. The patient agreed with the plan and demonstrated an understanding of the instructions.  A copy of instructions were sent to the patient via MyChart unless otherwise noted below.     The patient was advised to call back or seek an in-person evaluation if the symptoms worsen or if the condition fails to improve as anticipated.    Yobany Vroom, FNP

## 2023-12-11 ENCOUNTER — Other Ambulatory Visit: Payer: Self-pay | Admitting: Obstetrics and Gynecology

## 2023-12-11 DIAGNOSIS — Z1231 Encounter for screening mammogram for malignant neoplasm of breast: Secondary | ICD-10-CM

## 2023-12-26 ENCOUNTER — Ambulatory Visit
Admission: RE | Admit: 2023-12-26 | Discharge: 2023-12-26 | Disposition: A | Discharge status: Home / Self Care | Source: Ambulatory Visit

## 2023-12-26 DIAGNOSIS — Z1231 Encounter for screening mammogram for malignant neoplasm of breast: Secondary | ICD-10-CM

## 2024-01-17 ENCOUNTER — Telehealth: Payer: Self-pay | Admitting: General Practice

## 2024-01-17 NOTE — Telephone Encounter (Signed)
 1st attempt: Called patient to reschedule appointment for 9/25 as provider will not be in the office that day. Left Voicemail for patient to call back.

## 2024-01-21 NOTE — Telephone Encounter (Signed)
 Appointment scheduled for 02/20/2024 at 9:50 am.

## 2024-01-23 ENCOUNTER — Ambulatory Visit (INDEPENDENT_AMBULATORY_CARE_PROVIDER_SITE_OTHER): Admitting: Primary Care

## 2024-02-20 ENCOUNTER — Ambulatory Visit (INDEPENDENT_AMBULATORY_CARE_PROVIDER_SITE_OTHER): Admitting: Primary Care

## 2024-03-08 ENCOUNTER — Emergency Department (HOSPITAL_COMMUNITY)

## 2024-03-08 ENCOUNTER — Inpatient Hospital Stay (HOSPITAL_COMMUNITY)
Admission: EM | Admit: 2024-03-08 | Discharge: 2024-03-11 | DRG: 065 | Disposition: A | Discharge status: Home / Self Care | Attending: Internal Medicine | Admitting: Internal Medicine

## 2024-03-08 ENCOUNTER — Other Ambulatory Visit: Payer: Self-pay

## 2024-03-08 DIAGNOSIS — I63411 Cerebral infarction due to embolism of right middle cerebral artery: Secondary | ICD-10-CM

## 2024-03-08 DIAGNOSIS — Z716 Tobacco abuse counseling: Secondary | ICD-10-CM

## 2024-03-08 DIAGNOSIS — I63311 Cerebral infarction due to thrombosis of right middle cerebral artery: Secondary | ICD-10-CM | POA: Diagnosis not present

## 2024-03-08 DIAGNOSIS — F17210 Nicotine dependence, cigarettes, uncomplicated: Secondary | ICD-10-CM | POA: Diagnosis present

## 2024-03-08 DIAGNOSIS — I10 Essential (primary) hypertension: Secondary | ICD-10-CM

## 2024-03-08 DIAGNOSIS — I639 Cerebral infarction, unspecified: Secondary | ICD-10-CM | POA: Diagnosis present

## 2024-03-08 DIAGNOSIS — Z833 Family history of diabetes mellitus: Secondary | ICD-10-CM

## 2024-03-08 DIAGNOSIS — N179 Acute kidney failure, unspecified: Secondary | ICD-10-CM | POA: Diagnosis not present

## 2024-03-08 DIAGNOSIS — E785 Hyperlipidemia, unspecified: Secondary | ICD-10-CM | POA: Diagnosis present

## 2024-03-08 DIAGNOSIS — Z6841 Body Mass Index (BMI) 40.0 and over, adult: Secondary | ICD-10-CM

## 2024-03-08 DIAGNOSIS — E669 Obesity, unspecified: Secondary | ICD-10-CM | POA: Diagnosis present

## 2024-03-08 DIAGNOSIS — Z79899 Other long term (current) drug therapy: Secondary | ICD-10-CM

## 2024-03-08 DIAGNOSIS — R29700 NIHSS score 0: Secondary | ICD-10-CM | POA: Diagnosis present

## 2024-03-08 DIAGNOSIS — F1290 Cannabis use, unspecified, uncomplicated: Secondary | ICD-10-CM | POA: Diagnosis present

## 2024-03-08 DIAGNOSIS — I161 Hypertensive emergency: Secondary | ICD-10-CM | POA: Diagnosis present

## 2024-03-08 DIAGNOSIS — F1410 Cocaine abuse, uncomplicated: Secondary | ICD-10-CM | POA: Diagnosis present

## 2024-03-08 DIAGNOSIS — M25572 Pain in left ankle and joints of left foot: Secondary | ICD-10-CM | POA: Diagnosis not present

## 2024-03-08 DIAGNOSIS — E876 Hypokalemia: Secondary | ICD-10-CM | POA: Diagnosis present

## 2024-03-08 DIAGNOSIS — Z889 Allergy status to unspecified drugs, medicaments and biological substances status: Secondary | ICD-10-CM

## 2024-03-08 LAB — LIPID PANEL
Cholesterol: 157 mg/dL (ref 0–200)
HDL: 50 mg/dL (ref >40)
LDL Cholesterol: 91 mg/dL (ref 0–99)
Total CHOL/HDL Ratio: 3.1 ratio
Triglycerides: 79 mg/dL (ref <150)
VLDL: 16 mg/dL (ref 0–40)

## 2024-03-08 LAB — BASIC METABOLIC PANEL WITH GFR
Anion gap: 18 — ABNORMAL HIGH (ref 5–15)
BUN: 12 mg/dL (ref 6–20)
CO2: 21 mmol/L — ABNORMAL LOW (ref 22–32)
Calcium: 9 mg/dL (ref 8.9–10.3)
Chloride: 101 mmol/L (ref 98–111)
Creatinine, Ser: 0.94 mg/dL (ref 0.44–1.00)
GFR, Estimated: >60 mL/min
Glucose, Bld: 78 mg/dL (ref 70–99)
Potassium: 4.5 mmol/L (ref 3.5–5.1)
Sodium: 140 mmol/L (ref 135–145)

## 2024-03-08 LAB — CBC
HCT: 28.8 % — ABNORMAL LOW (ref 36.0–46.0)
Hemoglobin: 8.7 g/dL — ABNORMAL LOW (ref 12.0–15.0)
MCH: 30.7 pg (ref 26.0–34.0)
MCHC: 30.2 g/dL (ref 30.0–36.0)
MCV: 101.8 fL — ABNORMAL HIGH (ref 80.0–100.0)
Platelets: 219 K/uL (ref 150–400)
RBC: 2.83 MIL/uL — ABNORMAL LOW (ref 3.87–5.11)
RDW: 12.3 % (ref 11.5–15.5)
WBC: 6.3 K/uL (ref 4.0–10.5)
nRBC: 0 % (ref 0.0–0.2)

## 2024-03-08 LAB — TROPONIN I (HIGH SENSITIVITY)
Troponin I (High Sensitivity): 30 ng/L — ABNORMAL HIGH (ref <18)
Troponin I (High Sensitivity): 13 ng/L (ref <18)

## 2024-03-08 LAB — POC OCCULT BLOOD, ED: Fecal Occult Bld: NEGATIVE

## 2024-03-08 MED ORDER — ASPIRIN 81 MG PO TBEC
81.0000 mg | DELAYED_RELEASE_TABLET | Freq: Every day | ORAL | Status: DC
  Administered 2024-03-08 – 2024-03-11 (×4): 81 mg via ORAL
  Filled 2024-03-08 (×4): qty 1

## 2024-03-08 MED ORDER — LOSARTAN POTASSIUM 50 MG PO TABS
25.0000 mg | ORAL_TABLET | Freq: Once | ORAL | Status: AC
  Administered 2024-03-08: 25 mg via ORAL
  Filled 2024-03-08: qty 1

## 2024-03-08 MED ORDER — ROSUVASTATIN CALCIUM 5 MG PO TABS: 5.0000 mg | ORAL_TABLET | Freq: Every day | ORAL | Status: DC

## 2024-03-08 MED ORDER — IOHEXOL 350 MG/ML SOLN
75.0000 mL | Freq: Once | INTRAVENOUS | Status: AC | PRN
  Administered 2024-03-08: 75 mL via INTRAVENOUS

## 2024-03-08 MED ORDER — ROSUVASTATIN CALCIUM 20 MG PO TABS
20.0000 mg | ORAL_TABLET | Freq: Every day | ORAL | Status: DC
  Administered 2024-03-08: 20 mg via ORAL
  Filled 2024-03-08: qty 1

## 2024-03-08 MED ORDER — MIDAZOLAM HCL (PF) 2 MG/2ML IJ SOLN
2.0000 mg | Freq: Once | INTRAMUSCULAR | Status: AC
  Administered 2024-03-08: 2 mg via INTRAVENOUS
  Filled 2024-03-08: qty 2

## 2024-03-08 MED ORDER — CLOPIDOGREL BISULFATE 75 MG PO TABS
75.0000 mg | ORAL_TABLET | Freq: Every day | ORAL | Status: DC
  Administered 2024-03-08 – 2024-03-11 (×4): 75 mg via ORAL
  Filled 2024-03-08 (×4): qty 1

## 2024-03-08 MED ORDER — LIDOCAINE 5 % EX PTCH
1.0000 | MEDICATED_PATCH | CUTANEOUS | Status: DC
  Administered 2024-03-08: 1 via TRANSDERMAL
  Filled 2024-03-08 (×2): qty 1

## 2024-03-08 MED ORDER — ENOXAPARIN SODIUM 40 MG/0.4ML IJ SOSY
40.0000 mg | PREFILLED_SYRINGE | INTRAMUSCULAR | Status: DC
  Administered 2024-03-08 – 2024-03-10 (×3): 40 mg via SUBCUTANEOUS
  Filled 2024-03-08 (×4): qty 0.4

## 2024-03-08 MED ORDER — HYDRALAZINE HCL 20 MG/ML IJ SOLN
10.0000 mg | Freq: Once | INTRAMUSCULAR | Status: AC
  Administered 2024-03-08: 10 mg via INTRAVENOUS
  Filled 2024-03-08: qty 1

## 2024-03-08 MED ORDER — ACETAMINOPHEN 325 MG PO TABS
650.0000 mg | ORAL_TABLET | Freq: Four times a day (QID) | ORAL | Status: DC | PRN
Start: 2024-03-08 — End: 2024-03-11
  Administered 2024-03-08 – 2024-03-10 (×3): 650 mg via ORAL
  Filled 2024-03-08 (×3): qty 2

## 2024-03-08 NOTE — ED Provider Notes (Signed)
 [facility] Provider Note   CSN: [id number] Arrival date & time: 03/08/24  1001     Patient presents with: Dizziness and Hypertension   Debra Obrien is a 43 y.o. female past medical history significant for hypertension who presents concern for swimmy headed feeling, headache, high blood pressure after being out of her medication for the last 5 days, she endorses some intermittent chest pressure, no nausea, vomiting, diarrhea, denies any feeling of room spinning, denies any numbness, tingling    Dizziness Hypertension       Prior to Admission medications   Medication Sig Start Date End Date Taking? Authorizing Provider  amoxicillin -clavulanate (AUGMENTIN ) 875-125 MG tablet Take 1 tablet by mouth 2 (two) times daily. 04/17/23   Debra Elsie BROCKS, PA-C  fluticasone  (FLONASE ) 50 MCG/ACT nasal spray Place 2 sprays into both nostrils daily. 04/17/23   Debra Elsie BROCKS, PA-C  lisinopril  (ZESTRIL ) 10 MG tablet TAKE 1 TABLET BY MOUTH DAILY Patient not taking: Reported on 02/09/2019 01/23/19 05/07/20  Ervin, Michael L, MD    Allergies: Patient has no known allergies.    Review of Systems  Neurological:  Positive for dizziness.  All other systems reviewed and are negative.   Updated Vital Signs BP (!) 145/75 (BP Location: Right Arm)   Pulse 94   Temp 97.8 F (36.6 C) (Oral)   Resp (!) 25   SpO2 100%   Physical Exam Vitals and nursing note reviewed.  Constitutional:      General: She is not in acute distress.    Appearance: Normal appearance.  HENT:     Head: Normocephalic and atraumatic.  Eyes:     General:        Right eye: No discharge.        Left eye: No discharge.  Cardiovascular:     Rate and Rhythm: Normal rate and regular rhythm.     Heart sounds: No murmur heard.    No friction rub. No gallop.  Pulmonary:     Effort: Pulmonary effort is normal.     Breath sounds: Normal breath sounds.  Abdominal:     General:  Bowel sounds are normal.     Palpations: Abdomen is soft.  Skin:    General: Skin is warm and dry.     Capillary Refill: Capillary refill takes less than 2 seconds.  Neurological:     Mental Status: She is alert and oriented to person, place, and time.     Comments: Moves all 4 limbs spontaneously, CN II through XII grossly intact, can ambulate without difficulty, intact sensation throughout.   Psychiatric:        Mood and Affect: Mood normal.        Behavior: Behavior normal.     (all labs ordered are listed, but only abnormal results are displayed) Labs Reviewed  CBC - Abnormal; Notable for the following components:      Result Value   RBC 2.83 (*)    Hemoglobin 8.7 (*)    HCT 28.8 (*)    MCV 101.8 (*)    All other components within normal limits  BASIC METABOLIC PANEL WITH GFR  TROPONIN I (HIGH SENSITIVITY)    EKG: None  Radiology: CT Head Wo Contrast Addendum Date: 03/08/2024 ADDENDUM #1 ADDENDUM: Study discussed by telephone with Dr. Laurice at 11:21 hours on March 08, 2024. ---------------------------------------------------- Electronically signed by: Helayne Hurst MD 03/08/2024 11:26 AM EST RP Workstation: [id number]   Result Date: 03/08/2024  ORIGINAL REPORT EXAM: CT HEAD WITHOUT CONTRAST 03/08/2024 10:52:36 AM TECHNIQUE: CT of the head was performed without the administration of intravenous contrast. Automated exposure control, iterative reconstruction, and/or weight based adjustment of the mA/kV was utilized to reduce the radiation dose to as low as reasonably achievable. COMPARISON: Head CT 05/07/2020. CLINICAL HISTORY: 43 year old female with headache and neuro deficit. FINDINGS: BRAIN AND VENTRICLES: No acute hemorrhage. Confluent new hypodensity tracking from the posterior right temporal lobe into the lateral right parietal lobe (series 2 images 13 through 18). No regional mass effect. But On coronal image 31 this does NOT appear to be chronic encephalomalacia. No  hemorrhagic transformation. Background brain volume remains normal. Gray white differentiation is stable and within normal limits. No ventriculomegaly. Normal basilar cisterns. No extra-axial collection. No mass effect or midline shift. No suspicious intracranial vascular hyperdensity. Calcified atherosclerosis at the skull base. ORBITS: No acute abnormality. SINUSES: Visible paranasal sinuses, middle ears and mastoids remain well aerated. SOFT TISSUES AND SKULL: No acute soft tissue abnormality. No skull fracture. IMPRESSION: 1. 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 Electronically signed by: Helayne Hurst MD 03/08/2024 11:03 AM EST RP Workstation: [id number]   DG Chest Portable 1 View Result Date: 03/08/2024 EXAM: 1 VIEW(S) XRAY OF THE CHEST 03/08/2024 10:45:00 AM COMPARISON: Chest radiographs dated 04/24/2012. CLINICAL HISTORY: chest pain FINDINGS: LUNGS AND PLEURA: When allowing for portable technique: No focal pulmonary opacity. No pulmonary edema. No pleural effusion. No pneumothorax. HEART AND MEDIASTINUM: Borderline to mild cardiomegaly appears chronic and stable. No acute abnormality of the mediastinal silhouette. BONES AND SOFT TISSUES: No acute osseous abnormality. IMPRESSION: 1. No acute cardiopulmonary process. Electronically signed by: Helayne Hurst MD 03/08/2024 11:04 AM EST RP Workstation: [id number]     Procedures   Medications Ordered in the ED  midazolam PF (VERSED) injection 2 mg (has no administration in time range)  losartan (COZAAR) tablet 25 mg (25 mg Oral Given 03/08/24 1034)  hydrALAZINE (APRESOLINE) injection 10 mg (10 mg Intravenous Given 03/08/24 1034)                                    Medical Decision Making  Medical Decision Making:   Debra Obrien is a 43 y.o. female who presented to the ED today with high blood pressure, swimmy headed sensation, chest tightness detailed above.    External chart has  been reviewed including previous lab work, imaging from ED evaluation. Patient's presentation is complicated by their history of high blood pressure, obesity.  Patient placed on continuous vitals and telemetry monitoring while in ED which was reviewed periodically.  Complete initial physical exam performed, notably the patient  was no focal neurologic deficits noted, she is quite hypertensive on arrival, pressure 185/100.  Vital signs otherwise overall stable.  She has some spurious readings on respiratory rate, initially 8, has realistically been around 15 respirations per minute, did have 1 brief episode of tachypnea, 25 respirations per minute..    Reviewed and confirmed nursing documentation for past medical history, family history, social history.    Initial Assessment:   With the patient's presentation of elevated blood pressure readings, most likely diagnosis is hypertensive urgency. Other diagnoses associated with hypertensive emergency were considered including (but not limited to) intracranial hemorrhage, acute renal artery stenosis, acute kidney injury, myocardial stress, ophthalmologic emergencies. These are considered less likely due to history of present illness and physical  exam findings.   This is most consistent with an acute life/limb threatening illness complicated by underlying chronic conditions. Will evaluate for hypertensive emergency as below.  Initial Plan:   Screening labs including CBC and Metabolic panel to evaluate for infectious or metabolic etiology of disease.  CXR to evaluate for structural/infectious intrathoracic pathology.  Given headache, eval for ICH with CTH EKG and serial troponin to evaluate for cardiac pathology. Objective evaluation as below reviewed. Considered further administration of antihypertensives in ED, per consensus guidelines for [facility] of emergency physicians, acute treatment of hypertensive urgency alone in the emergency department is  not recommended.  If patient has evidence of hypertensive emergency on objective laboratory evaluation, will reevaluate.  Will monitor blood pressure while patient awaiting above laboratory studies.  Initial Study Results:   Laboratory  All laboratory results reviewed without evidence of clinically relevant pathology.   Exceptions include: CBC notable for anemia, hemoglobin 8.7 decreased from baseline 3 years ago, but no evidence of acute blood loss, suspect this is chronic in nature.  EKG EKG was reviewed independently. Rate, rhythm, axis, intervals all examined and without medically relevant abnormality. ST segments without concerns for elevations.    Radiology:  All images reviewed independently.  CT head with subacute appearing right posterior MCA territory infarct .  Pending CTA, MR brain agree with radiology report at this time.   CT Head Wo Contrast Addendum Date: 03/08/2024 ADDENDUM #1 ADDENDUM: Study discussed by telephone with Dr. Laurice at 11:21 hours on March 08, 2024. ---------------------------------------------------- Electronically signed by: Helayne Hurst MD 03/08/2024 11:26 AM EST RP Workstation: [id number]   Result Date: 03/08/2024 ORIGINAL REPORT EXAM: CT HEAD WITHOUT CONTRAST 03/08/2024 10:52:36 AM TECHNIQUE: CT of the head was performed without the administration of intravenous contrast. Automated exposure control, iterative reconstruction, and/or weight based adjustment of the mA/kV was utilized to reduce the radiation dose to as low as reasonably achievable. COMPARISON: Head CT 05/07/2020. CLINICAL HISTORY: 43 year old female with headache and neuro deficit. FINDINGS: BRAIN AND VENTRICLES: No acute hemorrhage. Confluent new hypodensity tracking from the posterior right temporal lobe into the lateral right parietal lobe (series 2 images 13 through 18). No regional mass effect. But On coronal image 31 this does NOT appear to be chronic encephalomalacia. No hemorrhagic  transformation. Background brain volume remains normal. Gray white differentiation is stable and within normal limits. No ventriculomegaly. Normal basilar cisterns. No extra-axial collection. No mass effect or midline shift. No suspicious intracranial vascular hyperdensity. Calcified atherosclerosis at the skull base. ORBITS: No acute abnormality. SINUSES: Visible paranasal sinuses, middle ears and mastoids remain well aerated. SOFT TISSUES AND SKULL: No acute soft tissue abnormality. No skull fracture. IMPRESSION: 1. 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 Electronically signed by: Helayne Hurst MD 03/08/2024 11:03 AM EST RP Workstation: [id number]   DG Chest Portable 1 View Result Date: 03/08/2024 EXAM: 1 VIEW(S) XRAY OF THE CHEST 03/08/2024 10:45:00 AM COMPARISON: Chest radiographs dated 04/24/2012. CLINICAL HISTORY: chest pain FINDINGS: LUNGS AND PLEURA: When allowing for portable technique: No focal pulmonary opacity. No pulmonary edema. No pleural effusion. No pneumothorax. HEART AND MEDIASTINUM: Borderline to mild cardiomegaly appears chronic and stable. No acute abnormality of the mediastinal silhouette. BONES AND SOFT TISSUES: No acute osseous abnormality. IMPRESSION: 1. No acute cardiopulmonary process. Electronically signed by: Helayne Hurst MD 03/08/2024 11:04 AM EST RP Workstation: [id number]      Consults: Case discussed with spoke with Dr. Lindzen who will plan to see patient in  consult, recommends medicine admission for stroke workup after lab work completed.   3:02 PM Care of Antoria Lanza Rote transferred to [facility] and Dr. Francesca at the end of my shift as the patient will require reassessment once labs/imaging have resulted. Patient presentation, ED course, and plan of care discussed with review of all pertinent labs and imaging. Please see his/her note for further details regarding further ED course and disposition.  Plan at time of handoff is pending BMP, plan for medicine admission for stroke workup. This may be altered or completely changed at the discretion of the oncoming team pending results of further workup.  Final diagnoses:  None    ED Discharge Orders     None          Rosan Sherlean VEAR DEVONNA 03/08/24 1502    Laurice Maude BROCKS, MD 03/09/24 [phone]

## 2024-03-08 NOTE — H&P (Signed)
 Date: 03/08/2024               Patient Name:  Debra Obrien MRN: [id number]  DOB: June 13, 1980 Age / Sex: 43 y.o., female   PCP: [facility]         Medical Service: Internal Medicine Teaching Service         Attending Physician: Dr. MICAEL Riis Winfrey      First Contact: Doyal Miyamoto, MD    Second Contact: Dr. Drue Grow, MD         Pager Information: First Contact Pager: [phone]   Second Contact Pager: [phone]   SUBJECTIVE   Chief Complaint: Dizziness  History of Present Illness: Debra Obrien is a 43 y.o. female with a history of HTN, PID, and substance use (Marijuana, cocaine, cigarettes), who presents with complaints of dizziness and weakness/fatigue since Saturday 11/8 morning.   Patient reports that on Friday 11/7 she took a double shift at work and got home around 2000, she reports that she then showered and went to sleep around 2200 in her usual state of health.  She reports that she woke up early Saturday 11/8 around 0300-0400, which is usual for her but she felt swimmy in her head, but does not recall any other symptoms aside from general fatigue.  She ate a bowl of cereal and then started to smoke a cigarette but began to feel dizzy so she put the cigarette down and then went back to bed.  She remembers having 2 bowel movements, the first was very dark nearly black in color, and the second was normal brown.  She reports that this is the first time she has had a stool that was very dark.  She reports that she thinks she slept all day Saturday which is very unusual for her as she normally does schoolwork and housework to prepare for the week ahead.  She reports that she woke up Sunday 11/9 feeling still swimmy headed and so she checked her blood pressure which was ~200s SBP, so decided to call EMS.  She denies any other symptoms aside from overall fatigue, but reports a headache that began when she arrived in the ED which is about a 5/10 globally.   She does report a few episodes of heart fluttering, but is not experiencing this currently.  When the fluttering occurs she reports some chest discomfort but explains that it does not feel like pain, and will sometimes radiate to her left arm.  Patient endorses a history of hypertension, which she takes losartan-hydrochlorothiazide  for but states that she ran out of this medication on Wednesday 11/5, and had not taken any since then.  She also takes rosuvastatin daily, which she reports that she last took on Friday 11/7.  She had a recent ear infection which she was treated for with prednisone, and states that this is cleared up.  She does report a history of occasional marijuana and cocaine use.  She has been trying to cut back on the cocaine and states that she last used cocaine on Wednesday 11/5.   ROS: Denies fever, chills, runny nose, sore throat, vision changes, hearing changes, chest pain, shortness of breath, difficulty breathing, nausea, vomiting, abdominal pain. Denies increased urinary frequency, pain with urination, or diarrhea. No recent falls. Endorses headache 5/10, dizziness, and constipation. Last BM was yesterday, she had 2, one was very dark close to black, the other was brown.    ED Course: Vitals: Blood pressure (!) 170/85,  pulse 92, temperature 97.8 F (36.6 C), temperature source Oral, resp. rate 16, SpO2 100%.  Labs: AG 18, Trop 30 --> 13, Hgb 8.7, Hct 28.8, FOBT negative Imaging:  CXR: No acute cardiopulmonary process.  CT Head: Subacute appearing posterior right MCA territory infarct, no hemorrhagic transformation or mass effect. MRI Brain: Signal abnormality and mild edema in the right temporoparietal lobes, likely reflecting subacute infarct. Recommend follow up MRI in 3 months to document resolution CT Angio Head/Neck: Pending  Received: IV Hydralazine 10 mg, oral Losartan 25 mg, IM Midazolam 2 mg  Consulted: Neuro, IMTS   Meds:  Patient reported:   Losartan-hydrochlorothiazide  50-12.5 mg daily, last dose taken on Wednesday 11/5 Rosuvastatin 5 mg daily, last dose taken on Friday 11/7  Current Meds  Medication Sig   losartan-hydrochlorothiazide  (HYZAAR) 50-12.5 MG tablet Take 1 tablet by mouth daily.   rosuvastatin (CRESTOR) 5 MG tablet Take 5 mg by mouth daily.   Vitamin D, Ergocalciferol, (DRISDOL) 1.25 MG (50000 UNIT) CAPS capsule Take 50,000 Units by mouth every 7 (seven) days.    Past Medical History HTN PID  Past Surgical History No past surgical history on file.   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  Substances: - Tobacco: 0.5 PPD for 30 years (15 pack year history)  - Alcohol: Occasional  - Recreational Drug: Marijauna, Cocaine   Family History:  Family History  Problem Relation Age of Onset   Diabetes Father      Allergies: Allergies as of 03/08/2024 - Review Complete 03/08/2024  Allergen Reaction Noted   Porcine (pork) protein-containing drug products  03/08/2024    Review of Systems: A complete ROS was negative except as per HPI.   OBJECTIVE:   Physical Exam: Blood pressure (!) 170/85, pulse 92, temperature 97.8 F (36.6 C), temperature source Oral, resp. rate 16, SpO2 100%.   Constitutional: well-appearing female sitting in hospital bed, in no acute distress.  HEENT: normocephalic atraumatic, mucous membranes moist Eyes: conjunctiva non-erythematous Neck: supple Cardiovascular: regular rate and rhythm, bilateral radial pulses 2+, bilateral dorsal pedal pulses 2+, brisk capillary refill bilateral feet and hands  Pulmonary/Chest: normal work of breathing on room air, lungs clear to auscultation bilaterally, mildly tender to palpation mid-sternal region  Abdominal: soft, non-tender,  non-distended MSK: normal bulk and tone. Neurological:   Neuro: *MS: A&O x4. Follows multi-step commands.  *Speech: no dysarthria or aphasia, able to name and repeat. *CN:    I: Deferred   II,III: PERRLA, VFF by confrontation, optic discs not visualized 2/2 pupillary constriction   III,IV,VI: EOMI w/o nystagmus, no ptosis   V: Sensation intact from V1 to V3 to LT   VII: Eyelid closure was full.  Smile symmetric.   VIII: Hearing intact to voice   IX,X: Voice normal, palate elevates symmetrically    XI: SCM/trap 5/5 bilat   XII: Tongue protrudes midline, no atrophy or fasciculations  *Motor:   Normal bulk.  No tremor, rigidity or bradykinesia. No pronator drift.   Strength: Dlt Bic Tri WE WrF FgS Gr HF KnF KnE PlF DoF    Left 5 5 5 5 5 5  4+ 5 5 5 5 5     Right 5 5 5 5 5 5  4+ 5 5 5 5 5    *Sensory: Intact to light touch throughout. Symmetric.  *  Coordination:  Heel-to-shin intact. *Gait: deferred  Skin: warm and dry, no ulcers or lesions on bilateral feet Psych: mood calm, behavior normal, thought content normal, judgement normal    Labs: CBC    Component Value Date/Time   WBC 6.3 03/08/2024 1127   RBC 2.83 (L) 03/08/2024 1127   HGB 8.7 (L) 03/08/2024 1127   HGB 11.9 (L) 11/07/2012 2337   HCT 28.8 (L) 03/08/2024 1127   HCT 34.8 (L) 11/07/2012 2337   PLT 219 03/08/2024 1127   PLT 325 11/07/2012 2337   MCV 101.8 (H) 03/08/2024 1127   MCV 90 11/07/2012 2337   MCH 30.7 03/08/2024 1127   MCHC 30.2 03/08/2024 1127   RDW 12.3 03/08/2024 1127   RDW 13.1 11/07/2012 2337   LYMPHSABS 3.2 05/07/2020 1202   LYMPHSABS 1.6 05/01/2011 1141   MONOABS 0.4 05/07/2020 1202   MONOABS 0.3 05/01/2011 1141   EOSABS 0.3 05/07/2020 1202   EOSABS 0.0 05/01/2011 1141   BASOSABS 0.0 05/07/2020 1202   BASOSABS 0.0 05/01/2011 1141     CMP     Component Value Date/Time   NA 140 03/08/2024 1149   NA 140 04/24/2012 1510   K 4.5 03/08/2024 1149   K 3.1 (L) 04/24/2012 1510   CL 101 03/08/2024  1149   CL 106 04/24/2012 1510   CO2 21 (L) 03/08/2024 1149   CO2 27 04/24/2012 1510   GLUCOSE 78 03/08/2024 1149   GLUCOSE 87 04/24/2012 1510   BUN 12 03/08/2024 1149   BUN 9 04/24/2012 1510   CREATININE 0.94 03/08/2024 1149   CREATININE 0.82 04/24/2012 1510   CALCIUM 9.0 03/08/2024 1149   CALCIUM 8.6 04/24/2012 1510   PROT 7.4 05/07/2020 1202   PROT 7.7 04/24/2012 1510   ALBUMIN 3.6 05/07/2020 1202   ALBUMIN 3.6 04/24/2012 1510   AST 17 05/07/2020 1202   AST 20 04/24/2012 1510   ALT 14 05/07/2020 1202   ALT 21 04/24/2012 1510   ALKPHOS 67 05/07/2020 1202   ALKPHOS 73 04/24/2012 1510   BILITOT 0.7 05/07/2020 1202   BILITOT 0.1 (L) 04/24/2012 1510   GFRNONAA >60 03/08/2024 1149   GFRNONAA >60 04/24/2012 1510   GFRAA >60 05/29/2019 1129   GFRAA >60 04/24/2012 1510    Imaging:  CT ANGIO HEAD NECK W WO CM Result Date: 03/08/2024 EXAM: CTA HEAD AND NECK WITHOUT AND WITH 03/08/2024 05:23:21 PM TECHNIQUE: CTA of the head and neck was performed without and with the administration of 75 mL of intravenous iohexol  (OMNIPAQUE ) 350 MG/ML injection. Multiplanar 2D and/or 3D reformatted images are provided for review. Automated exposure control, iterative reconstruction, and/or weight based adjustment of the mA/kV was utilized to reduce the radiation dose to as low as reasonably achievable. Stenosis of the internal carotid arteries measured using NASCET criteria. COMPARISON: Same day CT head and MRI head. CLINICAL HISTORY: Neuro deficit, acute, stroke suspected. FINDINGS: CTA NECK: AORTIC ARCH AND ARCH VESSELS: Common origin of the brachiocephalic and left common carotid arteries. There is mild atherosclerosis at the common origin without stenosis. The subclavian arteries are patent bilaterally. No dissection or arterial injury. CERVICAL CAROTID ARTERIES: The right carotid artery is patent from the origin to the skull base; there is no hemodynamically significant stenosis. The left carotid artery  is patent from the origin to the skull base; there is no hemodynamically significant stenosis. No dissection or arterial injury. CERVICAL VERTEBRAL ARTERIES: The vertebral arteries are patent from the origins to the vertebrobasilar confluence. Mild tortuosity of the  bilateral V2 segments. No dissection, arterial injury, or significant stenosis. LUNGS AND MEDIASTINUM: Unremarkable. SOFT TISSUES: There are enlarged bilateral level 1b cervical nodes measuring up to 1.1 cm in short axis. Recommend clinical follow up to resolution. BONES: No acute abnormality. CTA HEAD: ANTERIOR CIRCULATION: The intracranial internal carotid arteries are patent bilaterally. There is atherosclerosis involving the bilateral cavernous ICAs with mild stenosis and atherosclerotic plaque bilaterally. The middle cerebral arteries are patent bilaterally. The anterior cerebral arteries are patent bilaterally. No aneurysm. POSTERIOR CIRCULATION: The vertebral arteries are patent from the origins to the vertebrobasilar confluence. No significant stenosis of the posterior cerebral arteries. No significant stenosis of the basilar artery. No aneurysm. OTHER: No dural venous sinus thrombosis on this non-dedicated study. IMPRESSION: 1. No large vessel occlusion, hemodynamically significant stenosis, or aneurysm in the head or neck. 2. 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 3. Mild atherosclerosis at the common origin of the brachiocephalic and left common carotid arteries without stenosis. 4. Enlarged bilateral level 1b cervical lymph nodes measuring up to 1.1 cm in short axis; recommend clinical follow-up for resolution. Electronically signed by: Donnice Mania MD 03/08/2024 06:11 PM EST RP Workstation: [id number]   MR BRAIN WO CONTRAST Result Date: 03/08/2024 EXAM: MRI BRAIN WITHOUT CONTRAST 03/08/2024 05:17:43 PM TECHNIQUE: Multiplanar multisequence MRI of the head/brain was performed without  the administration of intravenous contrast. COMPARISON: Same day CT head. CLINICAL HISTORY: Headache, neuro deficit. FINDINGS: BRAIN AND VENTRICLES: No acute infarct. No intracranial hemorrhage. No mass. No midline shift. No hydrocephalus. There is T2/FLAIR hyperintensity involving the posterior right temporal lobe extending into the inferior right parietal lobe with mild gyral expansion and suggestion of edema within this region. There is prominent FLAIR hyperintensity of the cortex of the right temporoparietal lobes within this region associated diffusion signal abnormality likely reflecting T2 shine through. There is no restricted diffusion to suggest acute infarct. There are areas of intrinsic T1 hyperintensity along the cortex within the right temporoparietal lobes with possible subtle susceptibility which may reflect areas of cortical laminar necrosis. The sella is unremarkable. Normal flow voids. ORBITS: No acute abnormality. SINUSES AND MASTOIDS: Mucosal thickening in the ethmoid sinuses. Trace fluid in the mastoid air cells. BONES AND SOFT TISSUES: Normal marrow signal. No acute soft tissue abnormality. IMPRESSION: 1. Signal abnormality and mild edema in the right temporoparietal lobes, likely reflecting subacute infarct. Recommend follow up MRI in 3 months to document resolution. Electronically signed by: Donnice Mania MD 03/08/2024 05:59 PM EST RP Workstation: [id number]   CT Head Wo Contrast Addendum Date: 03/08/2024 ** ADDENDUM #1 **ADDENDUM: Study discussed by telephone with Dr. Laurice at 11:21 hours on March 08, 2024. ---------------------------------------------------- Electronically signed by: Helayne Hurst MD 03/08/2024 11:26 AM EST RP Workstation: [id number]   Result Date: 03/08/2024 **ORIGINAL REPORT ** EXAM: CT HEAD WITHOUT CONTRAST 03/08/2024 10:52:36 AM TECHNIQUE: CT of the head was performed without the administration of intravenous contrast. Automated exposure control, iterative  reconstruction, and/or weight based adjustment of the mA/kV was utilized to reduce the radiation dose to as low as reasonably achievable. COMPARISON: Head CT 05/07/2020. CLINICAL HISTORY: 43 year old female with headache and neuro deficit. FINDINGS: BRAIN AND VENTRICLES: No acute hemorrhage. Confluent new hypodensity tracking from the posterior right temporal lobe into the lateral right parietal lobe (series 2 images 13 through 18). No regional mass effect. But On coronal image 31 this does NOT appear to be chronic encephalomalacia. No hemorrhagic transformation. Background brain volume remains normal. Gray white differentiation is stable  and within normal limits. No ventriculomegaly. Normal basilar cisterns. No extra-axial collection. No mass effect or midline shift. No suspicious intracranial vascular hyperdensity. Calcified atherosclerosis at the skull base. ORBITS: No acute abnormality. SINUSES: Visible paranasal sinuses, middle ears and mastoids remain well aerated. SOFT TISSUES AND SKULL: No acute soft tissue abnormality. No skull fracture. IMPRESSION: 1. 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 Electronically signed by: Helayne Hurst MD 03/08/2024 11:03 AM EST RP Workstation: [id number]   DG Chest Portable 1 View Result Date: 03/08/2024 EXAM: 1 VIEW(S) XRAY OF THE CHEST 03/08/2024 10:45:00 AM COMPARISON: Chest radiographs dated 04/24/2012. CLINICAL HISTORY: chest pain FINDINGS: LUNGS AND PLEURA: When allowing for portable technique: No focal pulmonary opacity. No pulmonary edema. No pleural effusion. No pneumothorax. HEART AND MEDIASTINUM: Borderline to mild cardiomegaly appears chronic and stable. No acute abnormality of the mediastinal silhouette. BONES AND SOFT TISSUES: No acute osseous abnormality. IMPRESSION: 1. No acute cardiopulmonary process. Electronically signed by: Helayne Hurst MD 03/08/2024 11:04 AM EST RP Workstation: [id number]      EKG: pending   ASSESSMENT & PLAN:   Assessment & Plan by Problem: Principal Problem:   Acute cerebral infarction [facility]) Active Problems:   Hypertensive emergency   Debra Obrien is a 43 y.o. female with a history of HTN, PID, and substance use (Marijuana, cocaine, cigarettes), who presents with complaints of dizziness and weakness/fatigue since Saturday 11/8 morning, and admitted for subacute MCA infarct on hospital day 0  ##CVA: Subacute posterior right MCA infarct Patient presents with complaints of dizziness and weakness since Saturday 11/8 AM.  Last known normal was Friday 11/7 at 2200 before patient went to bed.  Global headache 5/10 pain began today. CT head obtained today showed subacute posterior right MCA territory infarct with no hemorrhagic transformation or mass effect.  On physical exam, no focal deficits noted, but patient with mildly decreased 4+/5 grip strength and complaints of overall weakness, though with 5/5 strength otherwise in all extremities.  She does report a history of hypertension and last took her losartan-hydrochlorothiazide  on Wednesday 11/5, though ran out of her medication.  She also notes her last cocaine use was on Wednesday as well.  We will plan to obtain labs as below, and increase patient's rosuvastatin for high intensity dosing.  She will need to be initiated on prophylactic therapy, but will defer to neurology recommendations.  Echo has been ordered, will follow-up on results. Neurology was consulted by the ED, appreciate their assistance and will follow up on official recommendations.  - Neurology follow, appreciate assistance  - f/u HgbA1c - f/u fasting lipid panel - Frequent neuro checks - Allow for permissive hypertension first 48 hrs with BP goal of < 220/110 (LLN 11/7 @ 2200)  - f/u Echocardiogram - Prophylactic therapy: f/u Neuro recs  - Increase to Rosuvastatin 20 mg daily  - Risk factor modification - Telemetry monitoring for possible  arrhythmia  - PT consult, OT consult, Speech consult - Stroke team to follow - Tylenol  650 mg q6 hrs prn   - Bedside swallow by RN  #HTN Patient has a history of hypertension and takes home losartan-hydrochlorothiazide  50-12.5 mg daily.  She reports that she last took this medication on Wednesday 11/5, but then ran out.  She reports this morning home SBP of 200s.  Last known normal was Friday 11/7 at 2200, we will plan to allow for massive hypertension for 48 hours, and then  - Hold home losartan-hydrochlorothiazide  50-12.5 mg daily -  Allow for permissive hypertension first 48 hrs with BP goal of < 220/110 (LLN 11/7 @ 2200)   #HLD Patient takes home rosuvastatin 5 mg daily.  Will plan to obtain lipid panel and increase for high intensity dosing. - f/u lipid panel - Start Rosuvastatin 20 mg daily   #Macrocytic Anemia Patient presents with Hgb of 8.7. MCV 101.  Last Hgb was 11.9 in 2022, though labs otherwise not available in the past 3 years. She does report general fatigue recently.  She had a black BM yesterday, though had another normal brown BM after that.  She denies any other episodes of melena or hematochezia, and denies hematuria or hematemesis.  FOBT obtained in the ED was negative.  - CBC - f/u iron, TIBC, ferritin, reticulocytes - f/u Vitamin B12 - f/u Folate     Best practice: Diet: Normal VTE: Enoxaparin IVF: None,None Code: Full  Disposition planning: Prior to Admission Living Arrangement: Home, living with 26 year old son Anticipated Discharge Location: Home  Dispo: Admit patient to Observation with expected length of stay less than 2 midnights.  Signed: Doyal Miyamoto, MD Internal Medicine Resident  03/08/2024, 7:55 PM  On Call pager: [phone]

## 2024-03-08 NOTE — ED Notes (Signed)
 Pt IV came out of lft hand. IV access present in rt ac

## 2024-03-08 NOTE — Hospital Course (Signed)
 HPI  Work , school , kids repeat as usual until she took a double shift on Friday and got home around 10 pm and went to sleep. She woke up on Saturday morning , took a shower ,ate a bowl of cereal and went back to bed.  She slept the whole day ( Saturday )  until she got up  3-4 am  on Sunday morning but she started feeling usual and her hands started  getting clammy. She felt she was swimming and she went to the living room to smoke a cigarette but felt very dizzy so she put the cig down.   She reports discomfort but not pain that comes and go . It last few seconds and sometimes radiate left arm. She is not sure what's going on but she knows her body is not right. She has run out her BP meds. Took them last on Wednesday.    Hyzaar - Took last wed Rosuvastatin - Last took on Friday Just finished prednisone for 5 days for ear infection  Denies N/V  Feels constipated  Denies any urinary symptoms   Smokes a blunt Live alone at home  [facility] Drinks occasional Smokes Cocaine ,last use was last Wednesday  Smokes cigs 10 cigs a day  30 years ago   Dad has had strokes in the past and  HTN  Mum died from heart attack in 06-Apr-2012 Will accepts blood products if needed   MRI BRAIN IMPRESSION: 1. Signal abnormality and mild edema in the right temporoparietal lobes, likely reflecting subacute infarct. Recommend follow up MRI in 3 months to document Resolution   11/10 Reports headache but said it got better with tylenol . Reports she is not craving cigarettes. She just want a blunt lol  Denies any new focal deficit Clear heart and lungs sounds Haven't had any BM H test normal Heel to shine test normal

## 2024-03-08 NOTE — ED Triage Notes (Signed)
 Pt [facility] with complaints of dizziness and htn for a few days. Pt ran out of her bp medications on Wednesday. Endorses intermittent chest pain, no n/v/d.

## 2024-03-08 NOTE — ED Notes (Signed)
 Pt crying and reports she has a 5/10 headache. Pt noted with tears and given a tissue. Pt stats she cannot get her medications filled.

## 2024-03-08 NOTE — Consult Note (Addendum)
 NEUROLOGY CONSULT NOTE   Date of service: March 08, 2024 Patient Name: Debra Obrien MRN:  [id number] DOB:  10-29-1980 Chief Complaint: Headache, swimmy head and stroke Requesting Provider: Francesco Elsie NOVAK, MD  History of Present Illness  Debra Obrien is a 43 y.o. female with hx of HTN, smoker, marijuana use, HLD, who presents with headache, swimmy feeling in her head and generalized weakness. She woke up with these on Saturday AM. She ran out of her BP meds a few days ago.  She had CT Head w/o contrast which was concerning for a subacute appearing R MCA stroke. MRI brain confirmed a signal abnormality and mild edema in the right temporoparietal lobes, likely reflecting subacute infarct.  She denies any complaints at this time. No prior hx of stroke, father had stroke. Endorses smoking 0.5ppd, smoking marijuana. No hx of DM2, endorses HTN and HLD. Reports difficulty controlled BP at home. Takes meds but BP still elevated.  LKW: Friday night. Modified rankin score: 0-Completely asymptomatic and back to baseline post- stroke IV Thrombolysis: not offered, outside window and too mild to treat. EVT: not offered, no LVO and outside window   NIHSS components Score: Comment  1a Level of Conscious 0[]  1[]  2[]  3[]      1b LOC Questions 0[]  1[]  2[]       1c LOC Commands 0[]  1[]  2[]       2 Best Gaze 0[]  1[]  2[]       3 Visual 0[]  1[]  2[]  3[]      4 Facial Palsy 0[]  1[]  2[]  3[]      5a Motor Arm - left 0[]  1[]  2[]  3[]  4[]  UN[]    5b Motor Arm - Right 0[]  1[]  2[]  3[]  4[]  UN[]    6a Motor Leg - Left 0[]  1[]  2[]  3[]  4[]  UN[]    6b Motor Leg - Right 0[]  1[]  2[]  3[]  4[]  UN[]    7 Limb Ataxia 0[]  1[]  2[]  UN[]      8 Sensory 0[]  1[]  2[]  UN[]      9 Best Language 0[]  1[]  2[]  3[]      10 Dysarthria 0[]  1[]  2[]  UN[]      11 Extinct. and Inattention 0[]  1[]  2[]       TOTAL: 0      ROS  Comprehensive ROS performed and pertinent positives documented in HPI   Past History   Past Medical History:   Diagnosis Date   Hypertension     No past surgical history on file.  Family History: Family History  Problem Relation Age of Onset   Diabetes Father     Social History  reports that she has been smoking cigarettes. She has never used smokeless tobacco. She reports current alcohol use. She reports current drug use. Drugs: Marijuana and Cocaine.  Allergies  Allergen Reactions   Porcine (Pork) Protein-Containing Drug Products     Medications   Current Facility-Administered Medications:    acetaminophen  (TYLENOL ) tablet 650 mg, 650 mg, Oral, Q6H PRN, Amoako, Prince, MD, 650 mg at 03/08/24 1921   enoxaparin (LOVENOX) injection 40 mg, 40 mg, Subcutaneous, Q24H, Amoako, Prince, MD   lidocaine  (LIDODERM ) 5 % 1 patch, 1 patch, Transdermal, Q24H, Nguyen, Diana, MD   rosuvastatin (CRESTOR) tablet 20 mg, 20 mg, Oral, Daily, Nguyen, Diana, MD  Current Outpatient Medications:    losartan-hydrochlorothiazide  (HYZAAR) 50-12.5 MG tablet, Take 1 tablet by mouth daily., Disp: , Rfl:    rosuvastatin (CRESTOR) 5 MG tablet, Take 5 mg by mouth daily., Disp: , Rfl:    Vitamin D, Ergocalciferol, (  DRISDOL) 1.25 MG (50000 UNIT) CAPS capsule, Take 50,000 Units by mouth every 7 (seven) days., Disp: , Rfl:    predniSONE (DELTASONE) 50 MG tablet, Take 50 mg by mouth daily. (Patient not taking: Reported on 04-03-2024), Disp: , Rfl:   Vitals   Vitals:   04/03/2024 1330 2024/04/03 1430 Apr 03, 2024 1715 April 03, 2024 1949  BP: (!) 145/75 (!) 168/98 (!) 170/85 (!) 162/80  Pulse: 94 83 92 85  Resp: (!) 25 15 16 19   Temp:    98.4 F (36.9 C)  TempSrc:    Oral  SpO2: 100% 100% 100% 100%    There is no height or weight on file to calculate BMI.   Physical Exam   General: Laying comfortably in bed; in no acute distress.  HENT: Normal oropharynx and mucosa. Normal external appearance of ears and nose.  Neck: Supple, no pain or tenderness  CV: No JVD. No peripheral edema.  Pulmonary: Symmetric Chest rise. Normal  respiratory effort.  Abdomen: Soft to touch, non-tender.  Ext: No cyanosis, edema, or deformity  Skin: No rash. Normal palpation of skin.   Musculoskeletal: Normal digits and nails by inspection. No clubbing.   Neurologic Examination  Mental status/Cognition: Alert, oriented to self, place, month and year, good attention.  Speech/language: Fluent, comprehension intact, object naming intact, repetition intact.  Cranial nerves:   CN II Pupils equal and reactive to light, no VF deficits    CN III,IV,VI EOM intact, no gaze preference or deviation, no nystagmus    CN V normal sensation in V1, V2, and V3 segments bilaterally    CN VII no asymmetry, no nasolabial fold flattening    CN VIII normal hearing to speech    CN IX & X normal palatal elevation, no uvular deviation    CN XI 5/5 head turn and 5/5 shoulder shrug bilaterally    CN XII midline tongue protrusion    Motor:  Muscle bulk: normal, tone normal, pronator drift none tremor none Mvmt Root Nerve  Muscle Right Left Comments  SA C5/6 Ax Deltoid 5 5   EF C5/6 Mc Biceps 5 5   EE C6/7/8 Rad Triceps 5 5   WF C6/7 Med FCR     WE C7/8 PIN ECU     F Ab C8/T1 U ADM/FDI 5 5   HF L1/2/3 Fem Illopsoas 5 5   KE L2/3/4 Fem Quad 5 5   DF L4/5 D Peron Tib Ant 5 5   PF S1/2 Tibial Grc/Sol 5 5    Sensation:  Light touch Intact throughout   Pin prick    Temperature    Vibration   Proprioception    Coordination/Complex Motor:  - Finger to Nose intact BL - Heel to shin intact BL - Rapid alternating movement are normal - Gait: deferred.  Labs/Imaging/Neurodiagnostic studies   CBC:  Recent Labs  Lab 04/03/24 1127  WBC 6.3  HGB 8.7*  HCT 28.8*  MCV 101.8*  PLT 219   Basic Metabolic Panel:  Lab Results  Component Value Date   NA 140 2024-04-03   K 4.5 2024/04/03   CO2 21 (L) April 03, 2024   GLUCOSE 78 Apr 03, 2024   BUN 12 04/03/2024   CREATININE 0.94 03-Apr-2024   CALCIUM 9.0 04-03-24   GFRNONAA >60 2024/04/03   GFRAA >60  05/29/2019   Lipid Panel: No results found for: LDLCALC HgbA1c: No results found for: HGBA1C Urine Drug Screen: No results found for: LABOPIA, COCAINSCRNUR, LABBENZ, AMPHETMU, THCU, LABBARB  Alcohol Level No results  found for: [facility] INR  Lab Results  Component Value Date   INR 0.9 05/07/2020   APTT  Lab Results  Component Value Date   APTT 29 05/07/2020   AED levels: No results found for: PHENYTOIN, ZONISAMIDE, LAMOTRIGINE, LEVETIRACETA  CT Head without contrast(Personally reviewed): Subacute appearing R MCA stroke.  CT angio Head and Neck with contrast(Personally reviewed): No LVO  MRI Brain(Personally reviewed): Subacute R MCA stroke  ASSESSMENT   Debra Obrien is a 43 y.o. female with hx of HTN, smoker, marijuana use, HLD, who presents with headache, swimmy feeling in her head and generalized weakness. Found to have a subacute appearing posterior R MCA stroke on CT Head and MRI Brain.  Etiology of infarct is unclear but appears embolic in nature.  RECOMMENDATIONS  - Frequent Neuro checks per stroke unit protocol - Recommend obtaining TTE  - Recommend obtaining Lipid panel with LDL - Please start statin if LDL > 70 - Recommend HbA1c to evaluate for diabetes and how well it is controlled. - Antithrombotic - Aspirin 81mg  daily along with plavix 75mg  daily x 21 days, followed by Aspirin 81mg  daily alone. - Recommend DVT ppx - SBP goal - aim for gradual normotension. - Recommend Telemetry monitoring for arrythmia - Recommend bedside swallow screen prior to PO intake. - Stroke education booklet - Recommend PT/OT/SLP consult - Recommend Urine Tox screen.  - - counseled her on the importance of quitting smoking to reduce risk of stroke in the future. - Enlarged bilateral level 1b cervical lymph nodes measuring up to 1.1 cm in short axis noted on CT Angio head and neck. Per radiology, recommend clinical follow-up for resolution. - Recommend follow  up MRI in 3 months to document resolution of noted R MCA stroke. ______________________________________________________________________  Plan discussed with patient and her emergency contact at the bedside. Plan discussed with IMTS residents overnight.  I personally spent a total of 75 minutes in the care of the patient today including preparing to see the patient, getting/reviewing separately obtained history, performing a medically appropriate exam/evaluation, counseling and educating, placing orders, referring and communicating with other health care professionals, documenting clinical information in the EHR, independently interpreting results, communicating results, and coordinating care.   Signed, Nole Robey, MD [facility]

## 2024-03-08 NOTE — ED Provider Notes (Signed)
  Accepted handoff at shift change from Prosperi PA-C. Please see prior provider note for more detail.   Briefly: Patient is 43 y.o. presents concern for swimmy headed feeling, headache, high blood pressure after being out of her medication for the last 5 days, she endorses some intermittent chest pressure, no nausea, vomiting, diarrhea, denies any feeling of room spinning, denies any numbness, tingling  Plan:  - patient will be admitted for stroke workup. Admission pending lab workup. - Initial troponin 30 downtrending to 13.  CBC without leukocytosis.  There is acute anemia with hemoglobin at 8.7 today.  Occult blood POC negative.  BMP with mildly low CO2 at 21.  There is also a mild anion gap at 18. - CT head concerning for right MCA territory infarct.  MRI brain/CTA head and neck pending.  Chest x-ray without acute cardiopulmonary disease. - Neurology consultation already obtained.  Dr. Lindzen recommends inpatient admission for stroke workup. - Consulted with family medicine resident who agrees to admit patient. Dr. Francesco admitting provider.   .Critical Care  Performed by: Hoy Nidia FALCON, PA-C Authorized by: Hoy Nidia FALCON, PA-C   Critical care provider statement:    Critical care time (minutes):  30   Critical care was necessary to treat or prevent imminent or life-threatening deterioration of the following conditions: acute stroke.   Critical care was time spent personally by me on the following activities:  Development of treatment plan with patient or surrogate, discussions with consultants, evaluation of patient's response to treatment, examination of patient, ordering and review of laboratory studies, ordering and review of radiographic studies, ordering and performing treatments and interventions, pulse oximetry, re-evaluation of patient's condition and review of old charts   Care discussed with: admitting provider       Hoy Nidia FALCON, PA-C 03/08/24  1818    Laurice Maude BROCKS, MD 03/09/24 [phone]

## 2024-03-09 ENCOUNTER — Encounter (HOSPITAL_COMMUNITY): Payer: Self-pay | Admitting: Internal Medicine

## 2024-03-09 ENCOUNTER — Observation Stay (HOSPITAL_COMMUNITY)

## 2024-03-09 ENCOUNTER — Inpatient Hospital Stay (HOSPITAL_COMMUNITY)

## 2024-03-09 DIAGNOSIS — Z716 Tobacco abuse counseling: Secondary | ICD-10-CM | POA: Diagnosis not present

## 2024-03-09 DIAGNOSIS — E669 Obesity, unspecified: Secondary | ICD-10-CM | POA: Diagnosis present

## 2024-03-09 DIAGNOSIS — I161 Hypertensive emergency: Secondary | ICD-10-CM | POA: Diagnosis present

## 2024-03-09 DIAGNOSIS — N179 Acute kidney failure, unspecified: Secondary | ICD-10-CM | POA: Diagnosis not present

## 2024-03-09 DIAGNOSIS — M25572 Pain in left ankle and joints of left foot: Secondary | ICD-10-CM | POA: Diagnosis not present

## 2024-03-09 DIAGNOSIS — F1410 Cocaine abuse, uncomplicated: Secondary | ICD-10-CM

## 2024-03-09 DIAGNOSIS — R29700 NIHSS score 0: Secondary | ICD-10-CM | POA: Diagnosis present

## 2024-03-09 DIAGNOSIS — I6389 Other cerebral infarction: Secondary | ICD-10-CM

## 2024-03-09 DIAGNOSIS — Z889 Allergy status to unspecified drugs, medicaments and biological substances status: Secondary | ICD-10-CM | POA: Diagnosis not present

## 2024-03-09 DIAGNOSIS — I639 Cerebral infarction, unspecified: Secondary | ICD-10-CM

## 2024-03-09 DIAGNOSIS — R569 Unspecified convulsions: Secondary | ICD-10-CM | POA: Diagnosis not present

## 2024-03-09 DIAGNOSIS — Z6841 Body Mass Index (BMI) 40.0 and over, adult: Secondary | ICD-10-CM | POA: Diagnosis not present

## 2024-03-09 DIAGNOSIS — I63311 Cerebral infarction due to thrombosis of right middle cerebral artery: Secondary | ICD-10-CM | POA: Diagnosis present

## 2024-03-09 DIAGNOSIS — E785 Hyperlipidemia, unspecified: Secondary | ICD-10-CM

## 2024-03-09 DIAGNOSIS — F1290 Cannabis use, unspecified, uncomplicated: Secondary | ICD-10-CM | POA: Diagnosis present

## 2024-03-09 DIAGNOSIS — I10 Essential (primary) hypertension: Secondary | ICD-10-CM | POA: Diagnosis present

## 2024-03-09 DIAGNOSIS — F17210 Nicotine dependence, cigarettes, uncomplicated: Secondary | ICD-10-CM | POA: Diagnosis present

## 2024-03-09 DIAGNOSIS — F1210 Cannabis abuse, uncomplicated: Secondary | ICD-10-CM | POA: Diagnosis not present

## 2024-03-09 DIAGNOSIS — Z833 Family history of diabetes mellitus: Secondary | ICD-10-CM | POA: Diagnosis not present

## 2024-03-09 DIAGNOSIS — E876 Hypokalemia: Secondary | ICD-10-CM | POA: Diagnosis present

## 2024-03-09 DIAGNOSIS — Z79899 Other long term (current) drug therapy: Secondary | ICD-10-CM | POA: Diagnosis not present

## 2024-03-09 LAB — CBC
HCT: 41.3 % (ref 36.0–46.0)
Hemoglobin: 13.6 g/dL (ref 12.0–15.0)
MCH: 30.4 pg (ref 26.0–34.0)
MCHC: 32.9 g/dL (ref 30.0–36.0)
MCV: 92.4 fL (ref 80.0–100.0)
Platelets: 374 K/uL (ref 150–400)
RBC: 4.47 MIL/uL (ref 3.87–5.11)
RDW: 12.5 % (ref 11.5–15.5)
WBC: 8.9 K/uL (ref 4.0–10.5)
nRBC: 0 % (ref 0.0–0.2)

## 2024-03-09 LAB — BASIC METABOLIC PANEL WITH GFR
Anion gap: 12 (ref 5–15)
BUN: 12 mg/dL (ref 6–20)
CO2: 26 mmol/L (ref 22–32)
Calcium: 9.1 mg/dL (ref 8.9–10.3)
Chloride: 97 mmol/L — ABNORMAL LOW (ref 98–111)
Creatinine, Ser: 0.94 mg/dL (ref 0.44–1.00)
GFR, Estimated: >60 mL/min (ref >60)
Glucose, Bld: 119 mg/dL — ABNORMAL HIGH (ref 70–99)
Potassium: 3.4 mmol/L — ABNORMAL LOW (ref 3.5–5.1)
Sodium: 135 mmol/L (ref 135–145)

## 2024-03-09 LAB — RETICULOCYTES
Immature Retic Fract: 15.9 % (ref 2.3–15.9)
RBC.: 4.44 MIL/uL (ref 3.87–5.11)
Retic Count, Absolute: 82.6 K/uL (ref 19.0–186.0)
Retic Ct Pct: 1.9 % (ref 0.4–3.1)

## 2024-03-09 LAB — ECHOCARDIOGRAM COMPLETE
Area-P 1/2: 4.06 cm2
S' Lateral: 4 cm

## 2024-03-09 LAB — RAPID URINE DRUG SCREEN, HOSP PERFORMED
Amphetamines: NOT DETECTED
Barbiturates: NOT DETECTED
Benzodiazepines: POSITIVE — AB
Cocaine: POSITIVE — AB
Opiates: NOT DETECTED
Tetrahydrocannabinol: POSITIVE — AB

## 2024-03-09 LAB — IRON AND TIBC
Iron: 66 ug/dL (ref 28–170)
Saturation Ratios: 19 % (ref 10.4–31.8)
TIBC: 343 ug/dL (ref 250–450)
UIBC: 277 ug/dL

## 2024-03-09 LAB — HEMOGLOBIN A1C
Hgb A1c MFr Bld: 5.8 % — ABNORMAL HIGH (ref 4.8–5.6)
Mean Plasma Glucose: 119.76 mg/dL

## 2024-03-09 LAB — FERRITIN: Ferritin: 38 ng/mL (ref 11–307)

## 2024-03-09 LAB — VITAMIN B12: Vitamin B-12: 364 pg/mL (ref 180–914)

## 2024-03-09 LAB — FOLATE: Folate: 8.6 ng/mL (ref >5.9)

## 2024-03-09 LAB — HIV ANTIBODY (ROUTINE TESTING W REFLEX): HIV Screen 4th Generation wRfx: NONREACTIVE

## 2024-03-09 MED ORDER — HYDROCHLOROTHIAZIDE 12.5 MG PO TABS
12.5000 mg | ORAL_TABLET | Freq: Every day | ORAL | Status: DC
  Administered 2024-03-10 – 2024-03-11 (×2): 12.5 mg via ORAL
  Filled 2024-03-09 (×2): qty 1

## 2024-03-09 MED ORDER — LOSARTAN POTASSIUM 50 MG PO TABS
50.0000 mg | ORAL_TABLET | Freq: Every day | ORAL | Status: DC
  Administered 2024-03-10 – 2024-03-11 (×2): 50 mg via ORAL
  Filled 2024-03-09 (×2): qty 1

## 2024-03-09 MED ORDER — GADOBUTROL 1 MMOL/ML IV SOLN
10.0000 mL | Freq: Once | INTRAVENOUS | Status: AC | PRN
  Administered 2024-03-09: 10 mL via INTRAVENOUS

## 2024-03-09 MED ORDER — ROSUVASTATIN CALCIUM 5 MG PO TABS
10.0000 mg | ORAL_TABLET | Freq: Every day | ORAL | Status: DC
  Administered 2024-03-09 – 2024-03-11 (×3): 10 mg via ORAL
  Filled 2024-03-09 (×3): qty 2

## 2024-03-09 MED ORDER — LORAZEPAM 1 MG PO TABS: 0.5000 mg | ORAL_TABLET | ORAL | Status: DC | PRN

## 2024-03-09 MED ORDER — DIAZEPAM 5 MG PO TABS
5.0000 mg | ORAL_TABLET | Freq: Once | ORAL | Status: AC
  Administered 2024-03-09: 5 mg via ORAL
  Filled 2024-03-09: qty 1

## 2024-03-09 MED ORDER — DIAZEPAM 2 MG PO TABS: 2.0000 mg | ORAL_TABLET | Freq: Once | ORAL | Status: DC

## 2024-03-09 MED ORDER — DIAZEPAM 5 MG/ML PO CONC: 5.0000 mg | Freq: Once | ORAL | Status: DC | PRN

## 2024-03-09 MED ORDER — STROKE: EARLY STAGES OF RECOVERY BOOK
Freq: Once | Status: AC
  Filled 2024-03-09: qty 1

## 2024-03-09 MED ORDER — LOSARTAN POTASSIUM-HCTZ 50-12.5 MG PO TABS: 1.0000 | ORAL_TABLET | Freq: Every day | ORAL | Status: DC

## 2024-03-09 MED ORDER — DIAZEPAM 5 MG PO TABS: 5.0000 mg | ORAL_TABLET | Freq: Once | ORAL | Status: DC

## 2024-03-09 MED ORDER — POTASSIUM CHLORIDE 20 MEQ PO PACK
40.0000 meq | PACK | Freq: Once | ORAL | Status: AC
  Administered 2024-03-09: 40 meq via ORAL
  Filled 2024-03-09: qty 2

## 2024-03-09 MED ORDER — NICOTINE 14 MG/24HR TD PT24
14.0000 mg | MEDICATED_PATCH | Freq: Every day | TRANSDERMAL | Status: DC
  Administered 2024-03-09 – 2024-03-11 (×3): 14 mg via TRANSDERMAL
  Filled 2024-03-09 (×3): qty 1

## 2024-03-09 NOTE — ED Notes (Signed)
 Pt transported to room from MRI. A&O X 4. Denies pain. Respirations even and unlabored.

## 2024-03-09 NOTE — Progress Notes (Signed)
 BLE venous duplex and TCD bubble study have been completed.   Results can be found under chart review under CV PROC. 03/09/2024 5:38 PM Presley Gora RVT, RDMS

## 2024-03-09 NOTE — Progress Notes (Signed)
 2D Echocardiogram completed

## 2024-03-09 NOTE — ED Notes (Signed)
 Assumed care of this patient at this time. Report received. Pt in MRI. Belongings brought to yellow zone room 41 from green zone.

## 2024-03-09 NOTE — Evaluation (Signed)
 Occupational Therapy Evaluation and Discharge Patient Details Name: Debra Obrien MRN: [id number] DOB: 06/28/1980 Today's Date: 03/09/2024   History of Present Illness   The pt is a 43 yo female presenting 11/9 with dizziness for a few days, recently ran out of BP meds. 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 PMH includes: HTN, HLD, PID, substance use ((Marijuana, cocaine, cigarettes).     Clinical Impressions Pt reports not yet feeling at her baseline. Understandably frustrated after having run out of her BP meds. She is functioning independently with intact vision, strength and coordination. No further OT needs.      If plan is discharge home, recommend the following:         Functional Status Assessment   Patient has not had a recent decline in their functional status     Equipment Recommendations   None recommended by OT     Recommendations for Other Services         Precautions/Restrictions   Precautions Precautions: Fall Precaution/Restrictions Comments: watch BP (HTN) Restrictions Weight Bearing Restrictions Per Provider Order: No     Mobility Bed Mobility Overal bed mobility: Independent                  Transfers Overall transfer level: Independent Equipment used: None                      Balance     Sitting balance-Leahy Scale: Normal       Standing balance-Leahy Scale: Good                             ADL either performed or assessed with clinical judgement   ADL Overall ADL's : Independent                                             Vision Baseline Vision/History: 0 No visual deficits Ability to See in Adequate Light: 0 Adequate Patient Visual Report: No change from baseline       Perception Perception: Within Functional Limits       Praxis Praxis: WFL       Pertinent Vitals/Pain Pain Assessment Pain Assessment:  Faces Faces Pain Scale: Hurts little more Pain Location: head Pain Descriptors / Indicators: Aching Pain Intervention(s): Monitored during session, Repositioned     Extremity/Trunk Assessment Upper Extremity Assessment Upper Extremity Assessment: Overall WFL for tasks assessed;Right hand dominant   Lower Extremity Assessment Lower Extremity Assessment: Defer to PT evaluation   Cervical / Trunk Assessment Cervical / Trunk Assessment: Normal Cervical / Trunk Exceptions: increased body habitus   Communication Communication Communication: No apparent difficulties   Cognition Arousal: Alert Behavior During Therapy: WFL for tasks assessed/performed Cognition: No apparent impairments             OT - Cognition Comments: tearful at times                 Following commands: Intact       Cueing  General Comments   Cueing Techniques: Verbal cues  BP 181/102 (124) in bed, 184/99 ((124) sitting EOB, 162/78 (104) standing after walking   Exercises     Shoulder Instructions      Home Living Family/patient expects to be discharged to:: Private residence Living Arrangements:  Children (10 y.o. 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 independent    OT Problem List:     OT Treatment/Interventions:        OT Goals(Current goals can be found in the care plan section)       OT  Frequency:       Co-evaluation              AM-PAC OT 6 Clicks Daily Activity     Outcome Measure Help from another person eating meals?: None Help from another person taking care of personal grooming?: None Help from another person toileting, which includes using toliet, bedpan, or urinal?: None Help from another person bathing (including washing, rinsing, drying)?: None Help from another person to put on and taking off regular upper body clothing?: None Help from another person to put on and taking off regular lower body clothing?: None 6 Click Score: 24   End of Session    Activity Tolerance: Patient tolerated treatment well Patient left: in bed;with call bell/phone within reach;Other (comment) (MD)  OT Visit Diagnosis: Dizziness and giddiness (R42)                Time: 8870-8854 OT Time Calculation (min): 16 min Charges:  OT General Charges $OT Visit: 1 Visit OT Evaluation $OT Eval Low Complexity: 1 Low  Mliss HERO, OTR/L Acute Rehabilitation Services Office: [phone]   Kennth Mliss Helling 03/09/2024, 11:54 AM

## 2024-03-09 NOTE — Progress Notes (Signed)
 SLP Cancellation Note  Patient Details Name: Debra Obrien MRN: [id number] DOB: 1980-07-19   Cancelled treatment:       Reason Eval/Treat Not Completed: SLP screened, no needs identified for swallowing, will sign off.   Pt has passed a swallow screen x2 and is on a diet. No concerns regarding swallowing per RN. SLP to defer swallow eval per protocol. However, given findings of subacute stroke on imaging, recommend ordering SLP cognitive-linguistic eval.     Leita SAILOR., M.A. CCC-SLP Acute Rehabilitation Services Office: [phone]  Secure chat preferred  03/09/2024, 9:24 AM

## 2024-03-09 NOTE — ED Notes (Signed)
 EEG at bedside.

## 2024-03-09 NOTE — Procedures (Signed)
 Patient Name: Debra Obrien  MRN: [id number]  Epilepsy Attending: Arlin MALVA Krebs  Referring Physician/Provider: Jerri Pfeiffer, MD  Date: 03/09/2024 Duration: 22.32 mins  Patient history: 43 year old female with right temporoparietal stroke.  EEG ordered for seizure.  Level of alertness: Awake  AEDs during EEG study: None  Technical aspects: This EEG study was done with scalp electrodes positioned according to the 10-20 International system of electrode placement. Electrical activity was reviewed with band pass filter of 1-70Hz , sensitivity of 7 uV/mm, display speed of 77mm/sec with a 60Hz  notched filter applied as appropriate. EEG data were recorded continuously and digitally stored.  Video monitoring was available and reviewed as appropriate.  Description: The posterior dominant rhythm consists of 9-10 Hz activity of moderate voltage (25-35 uV) seen predominantly in posterior head regions, symmetric and reactive to eye opening and eye closing. Physiologic photic driving was not seen during photic stimulation.  Hyperventilation was not performed.     IMPRESSION: This study is within normal limits. No seizures or epileptiform discharges were seen throughout the recording.  A normal interictal EEG does not exclude the diagnosis of epilepsy.   Debra Obrien

## 2024-03-09 NOTE — Plan of Care (Signed)
   Problem: Education: Goal: Knowledge of General Education information will improve Description: Including pain rating scale, medication(s)/side effects and non-pharmacologic comfort measures Outcome: Progressing   Problem: Coping: Goal: Level of anxiety will decrease Outcome: Progressing   Problem: Safety: Goal: Ability to remain free from injury will improve Outcome: Progressing

## 2024-03-09 NOTE — ED Notes (Signed)
 Pt remains in MRI

## 2024-03-09 NOTE — Progress Notes (Signed)
-----------------------------------------------------------  [facility]--------------------------------------------------- D(Data) A(Action) R(response)     Data: Assessed for downgrade.      Action: Contacted MD    Response: No response from group secure chat.       Sharolyn Batman, RN [facility]

## 2024-03-09 NOTE — Evaluation (Signed)
 Physical Therapy Evaluation Patient Details Name: Debra Obrien MRN: [id number] DOB: 11/26/1980 Today's Date: 03/09/2024  History of Present Illness  The pt is a 43 yo female presenting 11/9 with dizziness for a few days, recently ran out of BP meds. 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 PMH includes: HTN, HLD, PID, substance use ((Marijuana, cocaine, cigarettes).   Clinical Impression  Pt in bed upon arrival of PT, agreeable to evaluation at this time. Prior to admission the pt was completely independent, working full time and in school. Living with her 10yo son, but has other family in [place]. The pt was able to complete all transitions without assistance, and complete hallway ambulation without DME or assist. Pt ambulates with slowed gait, mild increased sway, and reports feet feel not normal, but was unable to provide any further details. Pt reports no change in headache with mobility, and no onset of dizziness with head movements or balance challenge. All strength, sensation, and coordination equal to testing. Will continue to follow acutely but no follow up therapies needed at this time, pt can return home with intermittent supervision or assist.   VITALS:  - supine in bed - BP: 181/102 (124); HR: 82bpm - sitting EOB - BP: 184/99 (124); HR: 100bpm - standing post-ambulation - BP: 162/78 (104); HR: 96bpm    If plan is discharge home, recommend the following: Help with stairs or ramp for entrance   Can travel by private vehicle        Equipment Recommendations None recommended by PT  Recommendations for Other Services       Functional Status Assessment Patient has had a recent decline in their functional status and demonstrates the ability to make significant improvements in function in a reasonable and predictable amount of time.     Precautions / Restrictions Precautions Precautions: Fall Recall of  Precautions/Restrictions: Intact Precaution/Restrictions Comments: watch BP (HTN) Restrictions Weight Bearing Restrictions Per Provider Order: No      Mobility  Bed Mobility Overal bed mobility: Independent                  Transfers Overall transfer level: Independent Equipment used: None               General transfer comment: no UE support, no instability, BP stable    Ambulation/Gait Ambulation/Gait assistance: Contact guard assist Gait Distance (Feet): 100 Feet Assistive device: None Gait Pattern/deviations: Step-through pattern, Decreased stride length Gait velocity: decreased Gait velocity interpretation: <1.31 ft/sec, indicative of household ambulator   General Gait Details: pt with increased sway and slowed gait, reports not feeling normal but is unable to qualify or describe in any way, BP 162/78 (104) after gait, no change in headache or onset of dizziness   Modified Rankin (Stroke Patients Only) Modified Rankin (Stroke Patients Only) Pre-Morbid Rankin Score: No symptoms Modified Rankin: Moderate disability     Balance Overall balance assessment: Mild deficits observed, not formally tested Sitting-balance support: No upper extremity supported, Feet supported Sitting balance-Leahy Scale: Normal     Standing balance support: No upper extremity supported, During functional activity Standing balance-Leahy Scale: Good               High level balance activites: Direction changes, Turns, Head turns High Level Balance Comments: no overt LOB or onset of dizziness, mildincreased sway             Pertinent Vitals/Pain Pain Assessment Pain Assessment: 0-10 Pain Score: 6  Pain Location: headache (L frontal) Pain Descriptors / Indicators: Headache Pain Intervention(s): Limited activity within patient's tolerance, Monitored during session, Repositioned    Home Living Family/patient expects to be discharged to:: Private residence Living  Arrangements: Children (10yo son) Available Help at Discharge: Family;Available PRN/intermittently Type of Home: House Home Access: Stairs to enter Entrance Stairs-Rails: Doctor, General Practice of Steps: 4-5   Home Layout: One level Home Equipment: None      Prior Function Prior Level of Function : Independent/Modified Independent;Driving;Working/employed             Mobility Comments: independent without DME, no falls, no exercise but is on her feet all day at work ADLs Comments: works at [employer] and 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 independent     Extremity/Trunk Assessment   Upper Extremity Assessment Upper Extremity Assessment: Overall WFL for tasks assessed    Lower Extremity Assessment Lower Extremity Assessment: Overall WFL for tasks assessed (grossly 4+/5 other than hip abd bilaterally, sensation intact and equal bilaterally, coordination intact bilaterally)    Cervical / Trunk Assessment Cervical / Trunk Assessment: Other exceptions Cervical / Trunk Exceptions: increased body habitus  Communication   Communication Communication: No apparent difficulties    Cognition Arousal: Alert Behavior During Therapy: WFL for tasks assessed/performed   PT - Cognitive impairments: No apparent impairments                         Following commands: Intact       Cueing Cueing Techniques: Verbal cues     General Comments General comments (skin integrity, edema, etc.): BP 181/102 (124) in bed, 184/99 ((124) sitting EOB, 162/78 (104) standing after walking    Exercises     Assessment/Plan    PT Assessment Patient needs continued PT services  PT Problem List Decreased activity tolerance;Decreased balance       PT Treatment Interventions Stair training;Gait training;Functional mobility training;Therapeutic activities;Therapeutic exercise;Balance training;Neuromuscular re-education    PT Goals (Current goals can be found in the Care Plan  section)  Acute Rehab PT Goals Patient Stated Goal: to return home PT Goal Formulation: With patient Time For Goal Achievement: 03/23/24 Potential to Achieve Goals: Good    Frequency Min 2X/week        AM-PAC PT 6 Clicks Mobility  Outcome Measure Help needed turning from your back to your side while in a flat bed without using bedrails?: None Help needed moving from lying on your back to sitting on the side of a flat bed without using bedrails?: None Help needed moving to and from a bed to a chair (including a wheelchair)?: None Help needed standing up from a chair using your arms (e.g., wheelchair or bedside chair)?: None Help needed to walk in hospital room?: A Little Help needed climbing 3-5 steps with a railing? : A Little 6 Click Score: 22    End of Session Equipment Utilized During Treatment: Gait belt Activity Tolerance: Patient tolerated treatment well Patient left: in bed;with call bell/phone within reach Nurse Communication: Mobility status PT Visit Diagnosis: Unsteadiness on feet (R26.81);Other abnormalities of gait and mobility (R26.89)    Time: 9165-9095 PT Time Calculation (min) (ACUTE ONLY): 30 min   Charges:   PT Evaluation $PT Eval Low Complexity: 1 Low PT Treatments $Gait Training: 8-22 mins PT General Charges $$ ACUTE PT VISIT: 1 Visit         Izetta Call, PT, DPT   Acute Rehabilitation Department Office [phone] Secure Chat Communication  Preferred  Izetta JULIANNA Call 03/09/2024, 9:26 AM

## 2024-03-09 NOTE — Progress Notes (Signed)
 HD#0 SUBJECTIVE:  Patient Summary: Debra Obrien is a 43 y.o. female with a history of HTN, PID, and substance use (Marijuana, cocaine, cigarettes), who is admitted for subacute MCA infarct and stroke work up.   Overnight Events:  None  Interim History:  This morning reports improving global headache 4/10 which is better from prior. She denies any new weakness or symptoms this morning. Has not had any more BM.    OBJECTIVE:  Vital Signs: Vitals:   03/09/24 0630 03/09/24 0755 03/09/24 0800 03/09/24 0801  BP:  (!) 184/116 (!) 193/105   Pulse: 74 94 89 88  Resp:   16   Temp:  (!) 97.4 F (36.3 C)    TempSrc:  Oral    SpO2: 96% 100% 100% 100%   Supplemental O2: Room Air SpO2: 100 %  There were no vitals filed for this visit.  No intake or output data in the 24 hours ending 03/09/24 0909 Net IO Since Admission: No IO data has been entered for this period [03/09/24 0909]  Physical Exam:  Constitutional: well-appearing female sitting in hospital bed, in no acute distress HEENT: normocephalic atraumatic, mucous membranes moist Cardiovascular: regular rate and rhythm, bilateral radial pulses 2+, bilateral dorsal pedal pulses 2+, brisk capillary refill bilateral feet and hands  Pulmonary/Chest: normal work of breathing on room air, lungs clear to auscultation bilaterally Abdominal: soft, non-tender, non-distended MSK: normal bulk and tone. Neurological:  Neuro: *MS: A&O x4. Follows multi-step commands.  *Speech: no dysarthria or aphasia, able to name and repeat. *CN:    I: Deferred   II,III: PERRLA, VFF by confrontation, optic discs not visualized 2/2 pupillary constriction   III,IV,VI: EOMI w/o nystagmus, no ptosis   V: Sensation intact from V1 to V3 to LT   VII: Eyelid closure was full.  Smile symmetric.   VIII: Hearing intact to voice   IX,X: Voice normal   XI: SCM/trap 5/5 bilat   XII: Tongue protrudes midline, no atrophy or fasciculations  *Motor:   Normal bulk.   No tremor, rigidity or bradykinesia. No pronator drift.    Strength: Dlt Bic Tri WE WrF FgS Gr HF KnF KnE PlF DoF    Left 5 5 5 5 5 5 5 5 5 5 5 5     Right 5 5 5 5 5 5 5 5 5 5 5 5     *Sensory: Intact to light touch throughout. Symmetric.  *Coordination:  Heel-to-shin intact. *Gait: deferred    Skin: warm and dry Psych: mood calm, behavior normal, thought content normal, judgement normal    Patient Lines/Drains/Airways Status     Active Line/Drains/Airways     Name Placement date Placement time Site Days   Peripheral IV 03/08/24 20 G Left;Posterior Hand 03/08/24  1003  Hand  1   Peripheral IV 03/08/24 20 G Anterior;Proximal;Right Forearm 03/08/24  1153  Forearm  1            Pertinent labs and imaging:      Latest Ref Rng & Units 03/09/2024    1:35 AM 03/08/2024   11:27 AM 05/07/2020   12:02 PM  CBC  WBC 4.0 - 10.5 K/uL 8.9  6.3  7.6   Hemoglobin 12.0 - 15.0 g/dL 86.3  8.7  88.0   Hematocrit 36.0 - 46.0 % 41.3  28.8  37.2   Platelets 150 - 400 K/uL 374  219  482        Latest Ref Rng & Units 03/09/2024  1:35 AM 03/08/2024   11:49 AM 05/07/2020   12:02 PM  CMP  Glucose 70 - 99 mg/dL 880  78  865   BUN 6 - 20 mg/dL 12  12  12    Creatinine 0.44 - 1.00 mg/dL 9.05  9.05  8.98   Sodium 135 - 145 mmol/L 135  140  139   Potassium 3.5 - 5.1 mmol/L 3.4  4.5  3.2   Chloride 98 - 111 mmol/L 97  101  99   CO2 22 - 32 mmol/L 26  21  26    Calcium 8.9 - 10.3 mg/dL 9.1  9.0  9.1   Total Protein 6.5 - 8.1 g/dL   7.4   Total Bilirubin 0.3 - 1.2 mg/dL   0.7   Alkaline Phos 38 - 126 U/L   67   AST 15 - 41 U/L   17   ALT 0 - 44 U/L   14     CT ANGIO HEAD NECK W WO CM Result Date: 03/08/2024 EXAM: CTA HEAD AND NECK WITHOUT AND WITH 03/08/2024 05:23:21 PM TECHNIQUE: CTA of the head and neck was performed without and with the administration of 75 mL of intravenous iohexol  (OMNIPAQUE ) 350 MG/ML injection. Multiplanar 2D and/or 3D reformatted images are provided for review. Automated  exposure control, iterative reconstruction, and/or weight based adjustment of the mA/kV was utilized to reduce the radiation dose to as low as reasonably achievable. Stenosis of the internal carotid arteries measured using NASCET criteria. COMPARISON: Same day CT head and MRI head. CLINICAL HISTORY: Neuro deficit, acute, stroke suspected. FINDINGS: CTA NECK: AORTIC ARCH AND ARCH VESSELS: Common origin of the brachiocephalic and left common carotid arteries. There is mild atherosclerosis at the common origin without stenosis. The subclavian arteries are patent bilaterally. No dissection or arterial injury. CERVICAL CAROTID ARTERIES: The right carotid artery is patent from the origin to the skull base; there is no hemodynamically significant stenosis. The left carotid artery is patent from the origin to the skull base; there is no hemodynamically significant stenosis. No dissection or arterial injury. CERVICAL VERTEBRAL ARTERIES: The vertebral arteries are patent from the origins to the vertebrobasilar confluence. Mild tortuosity of the bilateral V2 segments. No dissection, arterial injury, or significant stenosis. LUNGS AND MEDIASTINUM: Unremarkable. SOFT TISSUES: There are enlarged bilateral level 1b cervical nodes measuring up to 1.1 cm in short axis. Recommend clinical follow up to resolution. BONES: No acute abnormality. CTA HEAD: ANTERIOR CIRCULATION: The intracranial internal carotid arteries are patent bilaterally. There is atherosclerosis involving the bilateral cavernous ICAs with mild stenosis and atherosclerotic plaque bilaterally. The middle cerebral arteries are patent bilaterally. The anterior cerebral arteries are patent bilaterally. No aneurysm. POSTERIOR CIRCULATION: The vertebral arteries are patent from the origins to the vertebrobasilar confluence. No significant stenosis of the posterior cerebral arteries. No significant stenosis of the basilar artery. No aneurysm. OTHER: No dural venous sinus  thrombosis on this non-dedicated study. IMPRESSION: 1. No large vessel occlusion, hemodynamically significant stenosis, or aneurysm in the head or neck. 2. 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 3. Mild atherosclerosis at the common origin of the brachiocephalic and left common carotid arteries without stenosis. 4. Enlarged bilateral level 1b cervical lymph nodes measuring up to 1.1 cm in short axis; recommend clinical follow-up for resolution. Electronically signed by: Donnice Mania MD 03/08/2024 06:11 PM EST RP Workstation: [id number]   MR BRAIN WO CONTRAST Result Date: 03/08/2024 EXAM: MRI BRAIN WITHOUT CONTRAST 03/08/2024 05:17:43 PM TECHNIQUE:  Multiplanar multisequence MRI of the head/brain was performed without the administration of intravenous contrast. COMPARISON: Same day CT head. CLINICAL HISTORY: Headache, neuro deficit. FINDINGS: BRAIN AND VENTRICLES: No acute infarct. No intracranial hemorrhage. No mass. No midline shift. No hydrocephalus. There is T2/FLAIR hyperintensity involving the posterior right temporal lobe extending into the inferior right parietal lobe with mild gyral expansion and suggestion of edema within this region. There is prominent FLAIR hyperintensity of the cortex of the right temporoparietal lobes within this region associated diffusion signal abnormality likely reflecting T2 shine through. There is no restricted diffusion to suggest acute infarct. There are areas of intrinsic T1 hyperintensity along the cortex within the right temporoparietal lobes with possible subtle susceptibility which may reflect areas of cortical laminar necrosis. The sella is unremarkable. Normal flow voids. ORBITS: No acute abnormality. SINUSES AND MASTOIDS: Mucosal thickening in the ethmoid sinuses. Trace fluid in the mastoid air cells. BONES AND SOFT TISSUES: Normal marrow signal. No acute soft tissue abnormality. IMPRESSION: 1. Signal abnormality  and mild edema in the right temporoparietal lobes, likely reflecting subacute infarct. Recommend follow up MRI in 3 months to document resolution. Electronically signed by: Donnice Mania MD 03/08/2024 05:59 PM EST RP Workstation: [id number]   CT Head Wo Contrast Addendum Date: 03/08/2024 ** ADDENDUM #1 ** ADDENDUM: Study discussed by telephone with Dr. Laurice at 11:21 hours on March 08, 2024. ---------------------------------------------------- Electronically signed by: Helayne Hurst MD 03/08/2024 11:26 AM EST RP Workstation: [id number]   Result Date: 03/08/2024 **ORIGINAL REPORT **EXAM: CT HEAD WITHOUT CONTRAST 03/08/2024 10:52:36 AM TECHNIQUE: CT of the head was performed without the administration of intravenous contrast. Automated exposure control, iterative reconstruction, and/or weight based adjustment of the mA/kV was utilized to reduce the radiation dose to as low as reasonably achievable. COMPARISON: Head CT 05/07/2020. CLINICAL HISTORY: 43 year old female with headache and neuro deficit. FINDINGS: BRAIN AND VENTRICLES: No acute hemorrhage. Confluent new hypodensity tracking from the posterior right temporal lobe into the lateral right parietal lobe (series 2 images 13 through 18). No regional mass effect. But On coronal image 31 this does NOT appear to be chronic encephalomalacia. No hemorrhagic transformation. Background brain volume remains normal. Gray white differentiation is stable and within normal limits. No ventriculomegaly. Normal basilar cisterns. No extra-axial collection. No mass effect or midline shift. No suspicious intracranial vascular hyperdensity. Calcified atherosclerosis at the skull base. ORBITS: No acute abnormality. SINUSES: Visible paranasal sinuses, middle ears and mastoids remain well aerated. SOFT TISSUES AND SKULL: No acute soft tissue abnormality. No skull fracture. IMPRESSION: 1. Subacute appearing posterior right MCA territory infarct, no hemorrhagic transformation or  mass effect. Electronically signed by: Helayne Hurst MD 03/08/2024 11:03 AM EST RP Workstation: [id number]   DG Chest Portable 1 View Result Date: 03/08/2024 EXAM: 1 VIEW(S) XRAY OF THE CHEST 03/08/2024 10:45:00 AM COMPARISON: Chest radiographs dated 04/24/2012. CLINICAL HISTORY: chest pain FINDINGS: LUNGS AND PLEURA: When allowing for portable technique: No focal pulmonary opacity. No pulmonary edema. No pleural effusion. No pneumothorax. HEART AND MEDIASTINUM: Borderline to mild cardiomegaly appears chronic and stable. No acute abnormality of the mediastinal silhouette. BONES AND SOFT TISSUES: No acute osseous abnormality. IMPRESSION: 1. No acute cardiopulmonary process. Electronically signed by: Helayne Hurst MD 03/08/2024 11:04 AM EST RP Workstation: [id number]    ASSESSMENT/PLAN:  Assessment: Principal Problem:   Acute cerebral infarction [facility]) Active Problems:   Hypertensive emergency  Debra Obrien is a 43 y.o. female with a history of HTN, PID, and substance use (Marijuana, cocaine, cigarettes), who presents with  complaints of dizziness and weakness/fatigue since Saturday 11/8 morning, and admitted for subacute MCA infarct and stroke work up.   Plan: ##CVA: Subacute posterior right MCA infarct #Cocaine Use Patient presents with complaints of dizziness and weakness since Saturday 11/8 AM.  Last known normal was Friday 11/7 at 2200 before patient went to bed. CT head showed subacute posterior right MCA territory infarct with no hemorrhagic transformation or mass effect. On physical exam, no focal deficits noted, with 5/5 strength including grip, improved from admission. She has a history of difficult to control hypertension. She also notes her last cocaine use was on Wednesday as well, though AM UDS shows cocaine present so may have used more recently than her report. Echo with bubble showed EF of 60-65% and bubble study negative. A1C of 5.8. LDL not at goal 91. Neurology is following and  recommends Plavix 75 mg daily for 21 days and Aspirin 81 mg daily, then after 21 days ASA only thereafter. They also recommend Rosuvastatin 10 mg daily.  - Neurology follow, appreciate assistance   - f/u TEE  - f/u Bilateral Doppler U/S   - f/u US  transcranial doppler w/ bubble  - Frequent neuro checks - Allow for permissive hypertension with BP goal of < 220/110 (LLN 11/7 @ 2200)  - 11/10: gradual BP reduction  - Prophylactic therapy:   - Plavix 75 mg daily for 21 days and Aspirin 81 mg daily, then after 21 days ASA only thereafter   - Aspirin 81 mg daily  - Rosuvastatin 10 mg daily  - Risk factor modification - Telemetry monitoring for possible arrhythmia  - PT consult, OT consult, Speech consult: no follow up recommended  - Stroke team to follow - Tylenol  650 mg q6 hrs prn   - Continue cocaine cessation counseling    #HTN Patient has a history of hypertension and takes home losartan-hydrochlorothiazide  50-12.5 mg daily.  She reports that she last took this medication on Wednesday 11/5, but then ran out.  She reports this morning home SBP of 200s.  Last known normal was Friday 11/7 at 2200, we will plan to allow for massive hypertension and follow Neuro recommendations on gradual BP reduction.  - Hold home losartan-hydrochlorothiazide  50-12.5 mg daily - Allow for permissive hypertension with BP goal of < 220/110 (LLN 11/7 @ 2200)    #HLD Patient takes home rosuvastatin 5 mg daily. Goal LDL < 70. LDL elevated at 91. Neurology recommends Rosuvastatin 10 mg daily.  - f/u lipid panel -  Rosuvastatin 10 mg daily    #Cocaine Use Disorder #Tobacco Use Disorder  #Marijuana Use Have discussed cessation of substances with patient. Continue to recommend cessation counseling outpatient.  - TOC consult  #Macrocytic Anemia Patient presents with Hgb of 8.7. MCV 101.  Last Hgb was 11.9 in 2022, though labs otherwise not available in the past 3 years. She does report general fatigue recently.   She had a black BM yesterday, though had another normal brown BM after that.  She denies any other episodes of melena or hematochezia, and denies hematuria or hematemesis.  FOBT obtained in the ED was negative.  - CBC - f/u iron, TIBC, ferritin, reticulocytes - f/u Vitamin B12 - f/u Folate    Best Practice: Diet: Regular diet VTE: enoxaparin (LOVENOX) injection 40 mg Start: 03/08/24 2200 Code: Full  Disposition planning: Therapy Recs: None, DME: none DISPO: Anticipated discharge in 1-2 days to Home pending stroke work up.  Signature:  Doyal Miyamoto, MD [facility]  9:09 AM, 03/09/2024  On Call pager [phone]

## 2024-03-09 NOTE — ED Notes (Signed)
 Patient transported to MRI

## 2024-03-09 NOTE — Progress Notes (Addendum)
 STROKE TEAM PROGRESS NOTE   INTERIM HISTORY/SUBJECTIVE Reports started feeling swimmy headed and abnormal speech, she was unaware of her speech deficit but she was talking to a friend on the phone around 68am and her friend called EMS. About  3 weeks about she did have a sinus infection, but denies any neuro deficit at that time.  Will obtain MRI w contrast to look closer at the lesion.   OBJECTIVE  CBC    Component Value Date/Time   WBC 8.9 03/09/2024 0135   RBC 4.47 03/09/2024 0135   RBC 4.44 03/09/2024 0135   HGB 13.6 03/09/2024 0135   HGB 11.9 (L) 11/07/2012 2337   HCT 41.3 03/09/2024 0135   HCT 34.8 (L) 11/07/2012 2337   PLT 374 03/09/2024 0135   PLT 325 11/07/2012 2337   MCV 92.4 03/09/2024 0135   MCV 90 11/07/2012 2337   MCH 30.4 03/09/2024 0135   MCHC 32.9 03/09/2024 0135   RDW 12.5 03/09/2024 0135   RDW 13.1 11/07/2012 2337   LYMPHSABS 3.2 05/07/2020 1202   LYMPHSABS 1.6 05/01/2011 1141   MONOABS 0.4 05/07/2020 1202   MONOABS 0.3 05/01/2011 1141   EOSABS 0.3 05/07/2020 1202   EOSABS 0.0 05/01/2011 1141   BASOSABS 0.0 05/07/2020 1202   BASOSABS 0.0 05/01/2011 1141    BMET    Component Value Date/Time   NA 135 03/09/2024 0135   NA 140 04/24/2012 1510   K 3.4 (L) 03/09/2024 0135   K 3.1 (L) 04/24/2012 1510   CL 97 (L) 03/09/2024 0135   CL 106 04/24/2012 1510   CO2 26 03/09/2024 0135   CO2 27 04/24/2012 1510   GLUCOSE 119 (H) 03/09/2024 0135   GLUCOSE 87 04/24/2012 1510   BUN 12 03/09/2024 0135   BUN 9 04/24/2012 1510   CREATININE 0.94 03/09/2024 0135   CREATININE 0.82 04/24/2012 1510   CALCIUM 9.1 03/09/2024 0135   CALCIUM 8.6 04/24/2012 1510   GFRNONAA >60 03/09/2024 0135   GFRNONAA >60 04/24/2012 1510    IMAGING past 24 hours CT ANGIO HEAD NECK W WO CM  IMPRESSION: 1. No large vessel occlusion, hemodynamically significant stenosis, or aneurysm in the head or neck. 2. Atherosclerosis involving the bilateral cavernous internal carotid  arteries with mild stenosis. 3. Mild atherosclerosis at the common origin of the brachiocephalic and left common carotid arteries without stenosis. 4. Enlarged bilateral level 1b cervical lymph nodes measuring up to 1.1 cm in short axis; recommend clinical follow-up for resolution.  MR BRAIN IMPRESSION: 1. Signal abnormality and mild edema in the right temporoparietal lobes, likely reflecting subacute infarct. Recommend follow up MRI in 3 months to document resolution.  Vitals:   03/09/24 0801 03/09/24 0846 03/09/24 0850 03/09/24 0930  BP:  (!) 184/99 (!) 162/78 (!) 151/72  Pulse: 88 99 (!) 58 82  Resp:  17 19 13   Temp:      TempSrc:      SpO2: 100%   100%     PHYSICAL EXAM General:  Alert, well-nourished, well-developed patient in no acute distress Psych:  Mood and affect appropriate for situation CV: Regular rate and rhythm on monitor Respiratory:  Regular, unlabored respirations on room air GI: Abdomen soft and nontender   NEURO:  Mental Status: AA&Ox3, patient is able to give clear and coherent history Speech/Language: speech is without dysarthria or aphasia.  Naming, repetition, fluency, and comprehension intact.  Cranial Nerves:  II: PERRL. Visual fields full.  III, IV, VI: EOMI. Eyelids elevate symmetrically.  V:  Sensation is intact to light touch and symmetrical to face.  VII: Face is symmetrical resting and smiling VIII: hearing intact to voice. IX, X: Palate elevates symmetrically. Phonation is normal.  KP:Dynloizm shrug 5/5. XII: tongue is midline without fasciculations. Motor: 5/5 strength to all muscle groups tested.  Tone: is normal and bulk is normal Sensation- Intact to light touch bilaterally. Extinction absent to light touch to DSS.   Coordination: FTN intact bilaterally, HKS: no ataxia in BLE.No drift.  Gait- deferred  Most Recent NIH 0    ASSESSMENT/PLAN  [name] is a 43 y.o. female with history of HTN, smoker, marijuana use,  HLD, who presents with headache, swimmy feeling in her head and generalized weakness.  NIH on Admission 0  Subacute infarct:  right temporoparietal lobe infarct, appears subacute  Etiology:  cryptogenic, but at least partially related to substance abuse   Code Stroke CT head - Subacute appearing posterior right MCA territory infarct, no hemorrhagic transformation or mass effect. CTA head & neck -unremarkable MRI with and without contrast Signal abnormality and mild edema in the right temporoparietal lobes, likely reflecting subacute infarct Repeat MRI in 2-3 months to make sure the evolution fits subacute infarct TCD Bubble no PFO Venous duplex  pending  2D Echo EF 60-65%, bubble study negative  TEE pending LDL 91 HgbA1c 5.8 VTE prophylaxis - lovenox  No antithrombotic prior to admission, now on aspirin 81 mg daily and clopidogrel 75 mg daily for 3 weeks and then aspirin alone. Therapy recommendations:  No follow up needed  Disposition:  pending  ? Seizure for current episode Presented to ED for episode of foggy headed, speech difficulty Symptoms lasting briefly EEG normal No AEDs needed at this time  Hypertension Home meds:  Hyzaar Stable on the high end Restart home BP meds tomorrow Long-term BP goal normotensive  Hyperlipidemia Home meds:  Crestor 5 LDL 91, goal < 70 Now on Crestor 10 Continue statin at discharge  Cocaine abuse UDS positive for cocaine Cessation education provided Patient is willing to quit  Other Stroke Risk Factors THC abuse, UDS positive for THC, cessation education provided  Hospital day # 0  Patient seen and examined by NP/APP with MD. MD to update note as needed.   Debra Last, DNP, FNP-BC [facility] Pager: [phone]  ATTENDING NOTE: I reviewed above note and agree with the assessment and plan. Pt was seen and examined.   No family at bedside.  Patient currently neurologically intact, no focal neurodeficit.  Stated  yesterday she may had episode of swimmy headed and speech difficulty on the phone with boyfriend, her boyfriend called EMS.  On EMS arrival patient only had generalized weakness, no focal deficit.  MRI and CT suspect right MCA cortical subacute infarct, concerning patient episode yesterday?  Seizure.  EEG normal.  On DAPT and statin.  Stroke workup underway, so far negative, pending TEE.  However patient does have cocaine and THC on urine drug screening, could be partially because of stroke.  Cessation education provided  For detailed assessment and plan, please refer to above as I have made changes wherever appropriate.   Ary Cummins, MD PhD Stroke Neurology 03/09/2024 5:46 PM    To contact Stroke Continuity provider, please refer to [email or web address]. After hours, contact General Neurology

## 2024-03-09 NOTE — ED Notes (Signed)
 PT medicated per MAR. Call light within reach, encouraged to use when needs arise. Breathing is even and unlabored. PT agreeable to MRI with contrast at this time. Messaged provider to see if pt can have something prn for MRI.

## 2024-03-09 NOTE — ED Notes (Signed)
 [facility] contacted for meal tray for pt.

## 2024-03-09 NOTE — ED Notes (Signed)
 Per neuro they will speak with pt regarding mri.

## 2024-03-09 NOTE — ED Notes (Signed)
 PT would like to speak with physician regarding 2nd MRI. PT declining MRI at this time.

## 2024-03-09 NOTE — Progress Notes (Signed)
 EEG complete - results pending

## 2024-03-09 NOTE — ED Notes (Signed)
 Messaged pharmacy for medication for MRI.

## 2024-03-09 NOTE — ED Notes (Signed)
 PT at bedside ambulating pt in hallway.

## 2024-03-10 DIAGNOSIS — I63311 Cerebral infarction due to thrombosis of right middle cerebral artery: Secondary | ICD-10-CM | POA: Diagnosis not present

## 2024-03-10 DIAGNOSIS — I639 Cerebral infarction, unspecified: Secondary | ICD-10-CM

## 2024-03-10 DIAGNOSIS — F17210 Nicotine dependence, cigarettes, uncomplicated: Secondary | ICD-10-CM

## 2024-03-10 DIAGNOSIS — I6389 Other cerebral infarction: Secondary | ICD-10-CM | POA: Diagnosis not present

## 2024-03-10 DIAGNOSIS — F1410 Cocaine abuse, uncomplicated: Secondary | ICD-10-CM | POA: Diagnosis not present

## 2024-03-10 DIAGNOSIS — F1210 Cannabis abuse, uncomplicated: Secondary | ICD-10-CM | POA: Diagnosis not present

## 2024-03-10 DIAGNOSIS — R29700 NIHSS score 0: Secondary | ICD-10-CM | POA: Diagnosis not present

## 2024-03-10 LAB — BASIC METABOLIC PANEL WITH GFR
Anion gap: 9 (ref 5–15)
BUN: 17 mg/dL (ref 6–20)
CO2: 27 mmol/L (ref 22–32)
Calcium: 8.5 mg/dL — ABNORMAL LOW (ref 8.9–10.3)
Chloride: 100 mmol/L (ref 98–111)
Creatinine, Ser: 1.26 mg/dL — ABNORMAL HIGH (ref 0.44–1.00)
GFR, Estimated: 54 mL/min — ABNORMAL LOW (ref >60)
Glucose, Bld: 120 mg/dL — ABNORMAL HIGH (ref 70–99)
Potassium: 3.3 mmol/L — ABNORMAL LOW (ref 3.5–5.1)
Sodium: 136 mmol/L (ref 135–145)

## 2024-03-10 LAB — CBC
HCT: 40.6 % (ref 36.0–46.0)
Hemoglobin: 13.4 g/dL (ref 12.0–15.0)
MCH: 30.2 pg (ref 26.0–34.0)
MCHC: 33 g/dL (ref 30.0–36.0)
MCV: 91.6 fL (ref 80.0–100.0)
Platelets: 366 K/uL (ref 150–400)
RBC: 4.43 MIL/uL (ref 3.87–5.11)
RDW: 12.6 % (ref 11.5–15.5)
WBC: 9.6 K/uL (ref 4.0–10.5)
nRBC: 0 % (ref 0.0–0.2)

## 2024-03-10 MED ORDER — POTASSIUM CHLORIDE 20 MEQ PO PACK
40.0000 meq | PACK | ORAL | Status: AC
  Administered 2024-03-10 (×2): 40 meq via ORAL
  Filled 2024-03-10 (×2): qty 2

## 2024-03-10 NOTE — H&P (View-Only) (Signed)
 STROKE TEAM PROGRESS NOTE   INTERIM HISTORY/SUBJECTIVE No family at the bedside. Pt lying in bed, no acute event overnight. No complains. Admitted that she is smoking tobacco, heavy marijuana for years, and used cocaine lately. Risk factor modification education provided.   OBJECTIVE  CBC    Component Value Date/Time   WBC 9.6 03/10/2024 0120   RBC 4.43 03/10/2024 0120   HGB 13.4 03/10/2024 0120   HGB 11.9 (L) 11/07/2012 2337   HCT 40.6 03/10/2024 0120   HCT 34.8 (L) 11/07/2012 2337   PLT 366 03/10/2024 0120   PLT 325 11/07/2012 2337   MCV 91.6 03/10/2024 0120   MCV 90 11/07/2012 2337   MCH 30.2 03/10/2024 0120   MCHC 33.0 03/10/2024 0120   RDW 12.6 03/10/2024 0120   RDW 13.1 11/07/2012 2337   LYMPHSABS 3.2 05/07/2020 1202   LYMPHSABS 1.6 05/01/2011 1141   MONOABS 0.4 05/07/2020 1202   MONOABS 0.3 05/01/2011 1141   EOSABS 0.3 05/07/2020 1202   EOSABS 0.0 05/01/2011 1141   BASOSABS 0.0 05/07/2020 1202   BASOSABS 0.0 05/01/2011 1141    BMET    Component Value Date/Time   NA 136 03/10/2024 0120   NA 140 04/24/2012 1510   K 3.3 (L) 03/10/2024 0120   K 3.1 (L) 04/24/2012 1510   CL 100 03/10/2024 0120   CL 106 04/24/2012 1510   CO2 27 03/10/2024 0120   CO2 27 04/24/2012 1510   GLUCOSE 120 (H) 03/10/2024 0120   GLUCOSE 87 04/24/2012 1510   BUN 17 03/10/2024 0120   BUN 9 04/24/2012 1510   CREATININE 1.26 (H) 03/10/2024 0120   CREATININE 0.82 04/24/2012 1510   CALCIUM 8.5 (L) 03/10/2024 0120   CALCIUM 8.6 04/24/2012 1510   GFRNONAA 54 (L) 03/10/2024 0120   GFRNONAA >60 04/24/2012 1510    IMAGING past 24 hours CT ANGIO HEAD NECK W WO CM  IMPRESSION: 1. No large vessel occlusion, hemodynamically significant stenosis, or aneurysm in the head or neck. 2. 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 3. Mild atherosclerosis at the common origin of the brachiocephalic and left common carotid arteries without  stenosis. 4. Enlarged bilateral level 1b cervical lymph nodes measuring up to 1.1 cm in short axis; recommend clinical follow-up for resolution.  MR BRAIN IMPRESSION: 1. Signal abnormality and mild edema in the right temporoparietal lobes, likely reflecting subacute infarct. Recommend follow up MRI in 3 months to document resolution.  Vitals:   03/10/24 0019 03/10/24 0419 03/10/24 0825 03/10/24 1213  BP: (!) 185/83 (!) 188/97 (!) 171/100 (!) 208/98  Pulse: 89 83 77 (!) 104  Resp: 18 18 18 18   Temp: 98 F (36.7 C) 98 F (36.7 C) 97.7 F (36.5 C) 98.3 F (36.8 C)  TempSrc: Oral Oral Oral Oral  SpO2: 96% 100% 99% 99%  Weight:      Height:         PHYSICAL EXAM General:  Alert, well-nourished, well-developed patient in no acute distress Psych:  Mood and affect appropriate for situation CV: Regular rate and rhythm on monitor Respiratory:  Regular, unlabored respirations on room air GI: Abdomen soft and nontender   NEURO:  Mental Status: AA&Ox3, patient is able to give clear and coherent history Speech/Language: speech is without dysarthria or aphasia.  Naming, repetition, fluency, and comprehension intact.  Cranial Nerves:  II: PERRL. Visual fields full.  III, IV, VI: EOMI. Eyelids elevate symmetrically.  V: Sensation is intact to light touch and symmetrical  to face.  VII: Face is symmetrical resting and smiling VIII: hearing intact to voice. IX, X: Palate elevates symmetrically. Phonation is normal.  KP:Dynloizm shrug 5/5. XII: tongue is midline without fasciculations. Motor: 5/5 strength to all muscle groups tested.  Tone: is normal and bulk is normal Sensation- Intact to light touch bilaterally. Extinction absent to light touch to DSS.   Coordination: FTN intact bilaterally, HKS: no ataxia in BLE.No drift.  Gait- deferred  Most Recent NIH 0    ASSESSMENT/PLAN  [name] is a 43 y.o. female with history of HTN, smoker, marijuana use, HLD, who  presents with headache, swimmy feeling in her head and generalized weakness.  NIH on Admission 0  Subacute infarct:  right temporoparietal lobe infarct, appears subacute  Etiology:  cryptogenic, but could be related to multiple substance abuse   Code Stroke CT head - Subacute appearing posterior right MCA territory infarct, no hemorrhagic transformation or mass effect. CTA head & neck -unremarkable MRI with and without contrast Signal abnormality and mild edema in the right temporoparietal lobes, likely reflecting subacute infarct Repeat MRI in 2-3 months to make sure the evolution fits subacute infarct TCD Bubble no PFO Venous duplex no DVT 2D Echo EF 60-65%, bubble study negative  TEE pending LDL 91 HgbA1c 5.8 VTE prophylaxis - lovenox  No antithrombotic prior to admission, now on aspirin 81 mg daily and clopidogrel 75 mg daily for 3 weeks and then aspirin alone. Therapy recommendations:  No follow up needed  Disposition:  pending  ? Seizure episode Presented to ED for episode of foggy headed, speech difficulty Symptoms lasting briefly EEG normal No AEDs needed at this time  Hypertension Home meds:  Hyzaar Stable on the high end Restart home BP meds tomorrow Long-term BP goal normotensive  Hyperlipidemia Home meds:  Crestor 5 LDL 91, goal < 70 Now on Crestor 10 Continue statin at discharge  Cocaine abuse UDS positive for cocaine Cessation education provided Patient is willing to quit  Tobacco abuse Current smoker Smoking cessation counseling provided Pt is willing to quit  THC user Long term THC user Cessation education provided Pt is willing to quit  Other Stroke Risk Factors   Hospital day # 1   Debra Cummins, MD PhD Stroke Neurology 03/10/2024 12:52 PM    To contact Stroke Continuity provider, please refer to [email or web address]. After hours, contact General Neurology

## 2024-03-10 NOTE — TOC CAGE-AID Note (Addendum)
 Transition of Care [facility]) - CAGE-AID Screening   Patient Details  Name: Debra Obrien MRN: [id number] Date of Birth: 17-Aug-1980  [facility]) CM/SW Contact:    Landry DELENA Senters, RN Phone Number: 03/10/2024, 11:03 AM   Clinical Narrative:  CM provided patient with outpatient counseling resources. CAGE-AID Screening:    Have You Ever Felt You Ought to Cut Down on Your Drinking or Drug Use?: No Have People Annoyed You By Critizing Your Drinking Or Drug Use?: Yes Have You Felt Bad Or Guilty About Your Drinking Or Drug Use?: Yes Have You Ever Had a Drink or Used Drugs First Thing In The Morning to Steady Your Nerves or to Get Rid of a Hangover?: No CAGE-AID Score: 2  Substance Abuse Education Offered: Yes  Substance abuse interventions: SDOH Screening, Educational Materials

## 2024-03-10 NOTE — Anesthesia Preprocedure Evaluation (Signed)
 Anesthesia Evaluation  Patient identified by MRN, date of birth, ID band Patient awake    Reviewed: Allergy & Precautions, NPO status , Patient's Chart, lab work & pertinent test results  History of Anesthesia Complications Negative for: history of anesthetic complications  Airway Mallampati: II  TM Distance: >3 FB Neck ROM: Full    Dental no notable dental hx.    Pulmonary Current Smoker   Pulmonary exam normal        Cardiovascular hypertension, Pt. on medications Normal cardiovascular exam     Neuro/Psych CVA, No Residual Symptoms    GI/Hepatic negative GI ROS,,,(+)     substance abuse  cocaine use and marijuana use  Endo/Other    Class 3 obesity (BMI 48)  Renal/GU ARFRenal disease     Musculoskeletal negative musculoskeletal ROS (+)    Abdominal   Peds  Hematology negative hematology ROS (+)   Anesthesia Other Findings   Reproductive/Obstetrics                              Anesthesia Physical Anesthesia Plan  ASA: 4  Anesthesia Plan: MAC   Post-op Pain Management: Minimal or no pain anticipated   Induction:   PONV Risk Score and Plan: Treatment may vary due to age or medical condition and Propofol infusion  Airway Management Planned: Natural Airway and Nasal Cannula  Additional Equipment: None  Intra-op Plan:   Post-operative Plan:   Informed Consent: I have reviewed the patients History and Physical, chart, labs and discussed the procedure including the risks, benefits and alternatives for the proposed anesthesia with the patient or authorized representative who has indicated his/her understanding and acceptance.       Plan Discussed with: CRNA  Anesthesia Plan Comments:          Anesthesia Quick Evaluation

## 2024-03-10 NOTE — Progress Notes (Signed)
 STROKE TEAM PROGRESS NOTE   INTERIM HISTORY/SUBJECTIVE No family at the bedside. Pt lying in bed, no acute event overnight. No complains. Admitted that she is smoking tobacco, heavy marijuana for years, and used cocaine lately. Risk factor modification education provided.   OBJECTIVE  CBC    Component Value Date/Time   WBC 9.6 03/10/2024 0120   RBC 4.43 03/10/2024 0120   HGB 13.4 03/10/2024 0120   HGB 11.9 (L) 11/07/2012 2337   HCT 40.6 03/10/2024 0120   HCT 34.8 (L) 11/07/2012 2337   PLT 366 03/10/2024 0120   PLT 325 11/07/2012 2337   MCV 91.6 03/10/2024 0120   MCV 90 11/07/2012 2337   MCH 30.2 03/10/2024 0120   MCHC 33.0 03/10/2024 0120   RDW 12.6 03/10/2024 0120   RDW 13.1 11/07/2012 2337   LYMPHSABS 3.2 05/07/2020 1202   LYMPHSABS 1.6 05/01/2011 1141   MONOABS 0.4 05/07/2020 1202   MONOABS 0.3 05/01/2011 1141   EOSABS 0.3 05/07/2020 1202   EOSABS 0.0 05/01/2011 1141   BASOSABS 0.0 05/07/2020 1202   BASOSABS 0.0 05/01/2011 1141    BMET    Component Value Date/Time   NA 136 03/10/2024 0120   NA 140 04/24/2012 1510   K 3.3 (L) 03/10/2024 0120   K 3.1 (L) 04/24/2012 1510   CL 100 03/10/2024 0120   CL 106 04/24/2012 1510   CO2 27 03/10/2024 0120   CO2 27 04/24/2012 1510   GLUCOSE 120 (H) 03/10/2024 0120   GLUCOSE 87 04/24/2012 1510   BUN 17 03/10/2024 0120   BUN 9 04/24/2012 1510   CREATININE 1.26 (H) 03/10/2024 0120   CREATININE 0.82 04/24/2012 1510   CALCIUM 8.5 (L) 03/10/2024 0120   CALCIUM 8.6 04/24/2012 1510   GFRNONAA 54 (L) 03/10/2024 0120   GFRNONAA >60 04/24/2012 1510    IMAGING past 24 hours CT ANGIO HEAD NECK W WO CM  IMPRESSION: 1. No large vessel occlusion, hemodynamically significant stenosis, or aneurysm in the head or neck. 2. 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 3. Mild atherosclerosis at the common origin of the brachiocephalic and left common carotid arteries without  stenosis. 4. Enlarged bilateral level 1b cervical lymph nodes measuring up to 1.1 cm in short axis; recommend clinical follow-up for resolution.  MR BRAIN IMPRESSION: 1. Signal abnormality and mild edema in the right temporoparietal lobes, likely reflecting subacute infarct. Recommend follow up MRI in 3 months to document resolution.  Vitals:   03/10/24 0019 03/10/24 0419 03/10/24 0825 03/10/24 1213  BP: (!) 185/83 (!) 188/97 (!) 171/100 (!) 208/98  Pulse: 89 83 77 (!) 104  Resp: 18 18 18 18   Temp: 98 F (36.7 C) 98 F (36.7 C) 97.7 F (36.5 C) 98.3 F (36.8 C)  TempSrc: Oral Oral Oral Oral  SpO2: 96% 100% 99% 99%  Weight:      Height:         PHYSICAL EXAM General:  Alert, well-nourished, well-developed patient in no acute distress Psych:  Mood and affect appropriate for situation CV: Regular rate and rhythm on monitor Respiratory:  Regular, unlabored respirations on room air GI: Abdomen soft and nontender   NEURO:  Mental Status: AA&Ox3, patient is able to give clear and coherent history Speech/Language: speech is without dysarthria or aphasia.  Naming, repetition, fluency, and comprehension intact.  Cranial Nerves:  II: PERRL. Visual fields full.  III, IV, VI: EOMI. Eyelids elevate symmetrically.  V: Sensation is intact to light touch and symmetrical  to face.  VII: Face is symmetrical resting and smiling VIII: hearing intact to voice. IX, X: Palate elevates symmetrically. Phonation is normal.  KP:Dynloizm shrug 5/5. XII: tongue is midline without fasciculations. Motor: 5/5 strength to all muscle groups tested.  Tone: is normal and bulk is normal Sensation- Intact to light touch bilaterally. Extinction absent to light touch to DSS.   Coordination: FTN intact bilaterally, HKS: no ataxia in BLE.No drift.  Gait- deferred  Most Recent NIH 0    ASSESSMENT/PLAN  [name] is a 43 y.o. female with history of HTN, smoker, marijuana use, HLD, who  presents with headache, swimmy feeling in her head and generalized weakness.  NIH on Admission 0  Subacute infarct:  right temporoparietal lobe infarct, appears subacute  Etiology:  cryptogenic, but could be related to multiple substance abuse   Code Stroke CT head - Subacute appearing posterior right MCA territory infarct, no hemorrhagic transformation or mass effect. CTA head & neck -unremarkable MRI with and without contrast Signal abnormality and mild edema in the right temporoparietal lobes, likely reflecting subacute infarct Repeat MRI in 2-3 months to make sure the evolution fits subacute infarct TCD Bubble no PFO Venous duplex no DVT 2D Echo EF 60-65%, bubble study negative  TEE pending LDL 91 HgbA1c 5.8 VTE prophylaxis - lovenox  No antithrombotic prior to admission, now on aspirin 81 mg daily and clopidogrel 75 mg daily for 3 weeks and then aspirin alone. Therapy recommendations:  No follow up needed  Disposition:  pending  ? Seizure episode Presented to ED for episode of foggy headed, speech difficulty Symptoms lasting briefly EEG normal No AEDs needed at this time  Hypertension Home meds:  Hyzaar Stable on the high end Restart home BP meds tomorrow Long-term BP goal normotensive  Hyperlipidemia Home meds:  Crestor 5 LDL 91, goal < 70 Now on Crestor 10 Continue statin at discharge  Cocaine abuse UDS positive for cocaine Cessation education provided Patient is willing to quit  Tobacco abuse Current smoker Smoking cessation counseling provided Pt is willing to quit  THC user Long term THC user Cessation education provided Pt is willing to quit  Other Stroke Risk Factors   Hospital day # 1   Debra Cummins, MD PhD Stroke Neurology 03/10/2024 12:52 PM    To contact Stroke Continuity provider, please refer to [email or web address]. After hours, contact General Neurology

## 2024-03-10 NOTE — Evaluation (Signed)
 Speech Language Pathology Evaluation Patient Details Name: Debra Obrien MRN: [id number] DOB: 06-25-1980 Today's Date: 03/10/2024 Time: 8950-8887 SLP Time Calculation (min) (ACUTE ONLY): 23 min  Problem List:  Patient Active Problem List   Diagnosis Date Noted   Cerebrovascular accident (CVA) due to thrombosis of right middle cerebral artery (HCC) 03/09/2024   Acute cerebral infarction (HCC) 03/08/2024   History of PID 02/26/2019   Hypertensive emergency 02/26/2019   PID (acute pelvic inflammatory disease) 01/19/2019   Past Medical History:  Past Medical History:  Diagnosis Date   Hypertension    Past Surgical History: History reviewed. No pertinent surgical history. HPI:  The pt is a 43 yo female presenting 11/9 with dizziness for a few days, recently ran out of BP meds. 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 PMH includes: HTN, HLD, PID, substance use ((Marijuana, cocaine, cigarettes).   Assessment / Plan / Recommendation Clinical Impression  Pt lives with 83 yr old son, has 2 jobs and is currently a consulting civil engineer. Initially she stated speech was little slurred this am on phone but had not noted changes in cognition. Language is intact and speech 100% intelligible (educated re: strategies to facilitate). She demonstrates mild impairments in higher level cognitive abilities and as she became aware of her difficulty became teary stating I would have done so much better before. This is not normal for me.  Deficits were mild in memory recall subtest, although did not recall date when informed SLP would ask her at end of session. She was able to add numbers in calculation word problem accurately and timely but subtraction took her significantly longer which is different from baseline per pt. Able to read accurately and comprehend what was read. Given cognitive demands and responsibilities at home recommend outpatient ST which pt agreed with.  Per case manager, transportation is being set up for her for outpatient therapies. Will defer ST services to outpatient.    SLP Assessment  SLP Recommendation/Assessment: All further Speech Language Pathology needs can be addressed in the next venue of care SLP Visit Diagnosis: Cognitive communication deficit (R41.841)     Assistance Recommended at Discharge     Functional Status Assessment    Frequency and Duration           SLP Evaluation Cognition  Overall Cognitive Status: Impaired/Different from baseline Arousal/Alertness: Awake/alert Orientation Level: Oriented X4 Year: 2025 Month: November Day of Week: Correct Attention: Sustained Sustained Attention: Appears intact Memory:  (mild impairment on Cognistat subtest, did not recall date when asked to remember at end session) Awareness: Appears intact Problem Solving: Appears intact (longer to process than baseline) Behaviors: Lability Safety/Judgment: Appears intact       Comprehension  Auditory Comprehension Overall Auditory Comprehension: Appears within functional limits for tasks assessed Commands:  (2/3 more due to memory than comprehension) Visual Recognition/Discrimination Discrimination: Not tested Reading Comprehension Reading Status: Within funtional limits    Expression Expression Primary Mode of Expression: Verbal Verbal Expression Overall Verbal Expression: Appears within functional limits for tasks assessed Initiation: No impairment Level of Generative/Spontaneous Verbalization: Conversation Repetition:  (NT) Naming:  (NT) Pragmatics: No impairment Written Expression Written Expression: Not tested   Oral / Motor  Oral Motor/Sensory Function Overall Oral Motor/Sensory Function: Within functional limits Motor Speech Overall Motor Speech: Appears within functional limits for tasks assessed Respiration: Within functional limits Phonation: Normal Resonance: Within functional limits Articulation:  Within functional limitis Intelligibility: Intelligible Motor Planning: Within functional limits Motor Speech  Errors: Not applicable            Debra Obrien 03/10/2024, 11:50 AM

## 2024-03-10 NOTE — Progress Notes (Signed)
   [facility] has been requested to perform a transesophageal echocardiogram on Debra Obrien for Stroke.     The patient does NOT have any absolute or relative contraindications to a Transesophageal Echocardiogram (TEE).  The patient has: No other conditions that may impact this procedure. Based on BMI of 48, Neck circumference, and frequent snoring I suspect the patient has OSA but she has no formal diagnosis of OSA.   After careful review of history and examination, the risks and benefits of transesophageal echocardiogram have been explained including risks of esophageal damage, perforation (1:10,000 risk), bleeding, pharyngeal hematoma as well as other potential complications associated with conscious sedation including aspiration, arrhythmia, respiratory failure and death. Alternatives to treatment were discussed, questions were answered. Patient is willing to proceed.   Bonney Morse Clause, PA-C  03/10/2024 2:59 PM

## 2024-03-10 NOTE — Progress Notes (Signed)
 HD#1 SUBJECTIVE:  Patient Summary: Debra Obrien is a 43 y.o. female with a history of HTN, PID, and substance use (Marijuana, cocaine, cigarettes), who is admitted for subacute MCA infarct and stroke work up.   Overnight Events:  None  Interim History:  Patient feeling improved today. She had a headache earlier, but on interview headache is not present. Patient reports that if she speaks quickly, her speech is difficult to understand, but when she slows down she is able to articulate well. She has no new deficits or weakness that she has noted. Ready for TEE procedure tomorrow.    OBJECTIVE:  Vital Signs: Vitals:   03/09/24 2025 03/09/24 2224 03/10/24 0019 03/10/24 0419  BP: (!) 179/98  (!) 185/83 (!) 188/97  Pulse: 85  89 83  Resp: 18  18 18   Temp: 98.6 F (37 C)  98 F (36.7 C) 98 F (36.7 C)  TempSrc: Oral  Oral Oral  SpO2: 99%  96% 100%  Weight:  131.3 kg    Height:  5' 5 (1.651 m)     Supplemental O2: Room Air SpO2: 100 %  Filed Weights   03/09/24 2224  Weight: 131.3 kg     Intake/Output Summary (Last 24 hours) at 03/10/2024 9295 Last data filed at 03/09/2024 2040 Gross per 24 hour  Intake 120 ml  Output --  Net 120 ml   Net IO Since Admission: 120 mL [03/10/24 0704]  Physical Exam:  Constitutional: well-appearing female sitting in hospital bed, in no acute distress HEENT: normocephalic atraumatic, mucous membranes moist Cardiovascular: regular rate and rhythm, bilateral radial pulses 2+, bilateral dorsal pedal pulses 2+, brisk capillary refill bilateral feet and hands  Pulmonary/Chest: normal work of breathing on room air, lungs clear to auscultation bilaterally Abdominal: soft, non-tender, non-distended MSK: normal bulk and tone. Neurological:  Neuro: *MS: A&O x4. Follows multi-step commands.  *Speech: no dysarthria or aphasia, able to name and repeat. *CN:    I: Deferred   II,III: PERRLA, VFF by confrontation, optic discs not visualized 2/2  pupillary constriction   III,IV,VI: EOMI w/o nystagmus, no ptosis   V: Sensation intact from V1 to V3 to LT   VII: Eyelid closure was full.  Smile symmetric.   VIII: Hearing intact to voice   IX,X: Voice normal   XI: SCM/trap 5/5 bilat   XII: Tongue protrudes midline, no atrophy or fasciculations  *Motor:   Normal bulk.  No tremor, rigidity or bradykinesia. No pronator drift.    Strength: Dlt Bic Tri WE WrF FgS Gr HF KnF KnE PlF DoF    Left 5 5 5 5 5 5 5 5 5 5 5 5     Right 5 5 5 5 5 5 5 5 5 5 5 5     *Sensory: Intact to light touch throughout. Symmetric.  *Coordination:  Heel-to-shin intact. *Gait: deferred    Skin: warm and dry Psych: mood calm, behavior normal, thought content normal, judgement normal    Patient Lines/Drains/Airways Status     Active Line/Drains/Airways     Name Placement date Placement time Site Days   Peripheral IV 03/08/24 20 G Left;Posterior Hand 03/08/24  1003  Hand  1   Peripheral IV 03/08/24 20 G Anterior;Proximal;Right Forearm 03/08/24  1153  Forearm  1            Pertinent labs and imaging:      Latest Ref Rng & Units 03/10/2024    1:20 AM 03/09/2024    1:35 AM  03/08/2024   11:27 AM  CBC  WBC 4.0 - 10.5 K/uL 9.6  8.9  6.3   Hemoglobin 12.0 - 15.0 g/dL 86.5  86.3  8.7   Hematocrit 36.0 - 46.0 % 40.6  41.3  28.8   Platelets 150 - 400 K/uL 366  374  219        Latest Ref Rng & Units 03/10/2024    1:20 AM 03/09/2024    1:35 AM 03/08/2024   11:49 AM  CMP  Glucose 70 - 99 mg/dL 879  880  78   BUN 6 - 20 mg/dL 17  12  12    Creatinine 0.44 - 1.00 mg/dL 8.73  9.05  9.05   Sodium 135 - 145 mmol/L 136  135  140   Potassium 3.5 - 5.1 mmol/L 3.3  3.4  4.5   Chloride 98 - 111 mmol/L 100  97  101   CO2 22 - 32 mmol/L 27  26  21    Calcium 8.9 - 10.3 mg/dL 8.5  9.1  9.0     CT ANGIO HEAD NECK W WO CM Result Date: 03/08/2024 EXAM: CTA HEAD AND NECK WITHOUT AND WITH 03/08/2024 05:23:21 PM TECHNIQUE: CTA of the head and neck was performed  without and with the administration of 75 mL of intravenous iohexol  (OMNIPAQUE ) 350 MG/ML injection. Multiplanar 2D and/or 3D reformatted images are provided for review. Automated exposure control, iterative reconstruction, and/or weight based adjustment of the mA/kV was utilized to reduce the radiation dose to as low as reasonably achievable. Stenosis of the internal carotid arteries measured using NASCET criteria. COMPARISON: Same day CT head and MRI head. CLINICAL HISTORY: Neuro deficit, acute, stroke suspected. FINDINGS: CTA NECK: AORTIC ARCH AND ARCH VESSELS: Common origin of the brachiocephalic and left common carotid arteries. There is mild atherosclerosis at the common origin without stenosis. The subclavian arteries are patent bilaterally. No dissection or arterial injury. CERVICAL CAROTID ARTERIES: The right carotid artery is patent from the origin to the skull base; there is no hemodynamically significant stenosis. The left carotid artery is patent from the origin to the skull base; there is no hemodynamically significant stenosis. No dissection or arterial injury. CERVICAL VERTEBRAL ARTERIES: The vertebral arteries are patent from the origins to the vertebrobasilar confluence. Mild tortuosity of the bilateral V2 segments. No dissection, arterial injury, or significant stenosis. LUNGS AND MEDIASTINUM: Unremarkable. SOFT TISSUES: There are enlarged bilateral level 1b cervical nodes measuring up to 1.1 cm in short axis. Recommend clinical follow up to resolution. BONES: No acute abnormality. CTA HEAD: ANTERIOR CIRCULATION: The intracranial internal carotid arteries are patent bilaterally. There is atherosclerosis involving the bilateral cavernous ICAs with mild stenosis and atherosclerotic plaque bilaterally. The middle cerebral arteries are patent bilaterally. The anterior cerebral arteries are patent bilaterally. No aneurysm. POSTERIOR CIRCULATION: The vertebral arteries are patent from the origins to the  vertebrobasilar confluence. No significant stenosis of the posterior cerebral arteries. No significant stenosis of the basilar artery. No aneurysm. OTHER: No dural venous sinus thrombosis on this non-dedicated study. IMPRESSION: 1. No large vessel occlusion, hemodynamically significant stenosis, or aneurysm in the head or neck. 2. 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 3. Mild atherosclerosis at the common origin of the brachiocephalic and left common carotid arteries without stenosis. 4. Enlarged bilateral level 1b cervical lymph nodes measuring up to 1.1 cm in short axis; recommend clinical follow-up for resolution. Electronically signed by: Donnice Mania MD 03/08/2024 06:11 PM EST RP Workstation: [id number]  MR BRAIN WO CONTRAST Result Date: 03/08/2024 EXAM: MRI BRAIN WITHOUT CONTRAST 03/08/2024 05:17:43 PM TECHNIQUE: Multiplanar multisequence MRI of the head/brain was performed without the administration of intravenous contrast. COMPARISON: Same day CT head. CLINICAL HISTORY: Headache, neuro deficit. FINDINGS: BRAIN AND VENTRICLES: No acute infarct. No intracranial hemorrhage. No mass. No midline shift. No hydrocephalus. There is T2/FLAIR hyperintensity involving the posterior right temporal lobe extending into the inferior right parietal lobe with mild gyral expansion and suggestion of edema within this region. There is prominent FLAIR hyperintensity of the cortex of the right temporoparietal lobes within this region associated diffusion signal abnormality likely reflecting T2 shine through. There is no restricted diffusion to suggest acute infarct. There are areas of intrinsic T1 hyperintensity along the cortex within the right temporoparietal lobes with possible subtle susceptibility which may reflect areas of cortical laminar necrosis. The sella is unremarkable. Normal flow voids. ORBITS: No acute abnormality. SINUSES AND MASTOIDS: Mucosal  thickening in the ethmoid sinuses. Trace fluid in the mastoid air cells. BONES AND SOFT TISSUES: Normal marrow signal. No acute soft tissue abnormality. IMPRESSION: 1. Signal abnormality and mild edema in the right temporoparietal lobes, likely reflecting subacute infarct. Recommend follow up MRI in 3 months to document resolution. Electronically signed by: Donnice Mania MD 03/08/2024 05:59 PM EST RP Workstation: [id number]   CT Head Wo Contrast Addendum Date: 03/08/2024 ** ADDENDUM #1 ** ADDENDUM: Study discussed by telephone with Dr. Laurice at 11:21 hours on March 08, 2024. ---------------------------------------------------- Electronically signed by: Helayne Hurst MD 03/08/2024 11:26 AM EST RP Workstation: [id number]   Result Date: 03/08/2024 **ORIGINAL REPORT **EXAM: CT HEAD WITHOUT CONTRAST 03/08/2024 10:52:36 AM TECHNIQUE: CT of the head was performed without the administration of intravenous contrast. Automated exposure control, iterative reconstruction, and/or weight based adjustment of the mA/kV was utilized to reduce the radiation dose to as low as reasonably achievable. COMPARISON: Head CT 05/07/2020. CLINICAL HISTORY: 43 year old female with headache and neuro deficit. FINDINGS: BRAIN AND VENTRICLES: No acute hemorrhage. Confluent new hypodensity tracking from the posterior right temporal lobe into the lateral right parietal lobe (series 2 images 13 through 18). No regional mass effect. But On coronal image 31 this does NOT appear to be chronic encephalomalacia. No hemorrhagic transformation. Background brain volume remains normal. Gray white differentiation is stable and within normal limits. No ventriculomegaly. Normal basilar cisterns. No extra-axial collection. No mass effect or midline shift. No suspicious intracranial vascular hyperdensity. Calcified atherosclerosis at the skull base. ORBITS: No acute abnormality. SINUSES: Visible paranasal sinuses, middle ears and mastoids remain well  aerated. SOFT TISSUES AND SKULL: No acute soft tissue abnormality. No skull fracture. IMPRESSION: 1. 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 Electronically signed by: Helayne Hurst MD 03/08/2024 11:03 AM EST RP Workstation: [id number]   DG Chest Portable 1 View Result Date: 03/08/2024 EXAM: 1 VIEW(S) XRAY OF THE CHEST 03/08/2024 10:45:00 AM COMPARISON: Chest radiographs dated 04/24/2012. CLINICAL HISTORY: chest pain FINDINGS: LUNGS AND PLEURA: When allowing for portable technique: No focal pulmonary opacity. No pulmonary edema. No pleural effusion. No pneumothorax. HEART AND MEDIASTINUM: Borderline to mild cardiomegaly appears chronic and stable. No acute abnormality of the mediastinal silhouette. BONES AND SOFT TISSUES: No acute osseous abnormality. IMPRESSION: 1. No acute cardiopulmonary process. Electronically signed by: Helayne Hurst MD 03/08/2024 11:04 AM EST RP Workstation: [id number]    ASSESSMENT/PLAN:  Assessment: Principal Problem:   Acute cerebral infarction [facility]) Active Problems:   Hypertensive emergency   Cerebrovascular accident (CVA) due to thrombosis  of right middle cerebral artery (HCC)  Debra Obrien is a 43 y.o. female with a history of HTN, PID, and substance use (Marijuana, cocaine, cigarettes), who presents with complaints of dizziness and weakness/fatigue since Saturday 11/8 morning, and admitted for subacute MCA infarct and stroke work up.   Plan: ##CVA: Subacute posterior right MCA infarct #Cocaine Use Patient presents with complaints of dizziness and weakness since Saturday 11/8 AM.  Last known normal was Friday 11/7 at 2200 before patient went to bed. CT head showed subacute posterior right MCA territory infarct with no hemorrhagic transformation or mass effect. On physical exam, no focal deficits noted, strength 5/5 in all extremities. No headache during interview. She has a history of difficult to control  hypertension. She also notes her last cocaine use was on Wednesday 11/5 as well, though AM UDS shows cocaine present so may have used more recently than her report. Echo with bubble showed EF of 60-65% and bubble study negative. Transcranial doppler w/ bubble negative. Bilateral LE doppler U/S negative for DVT. A1C of 5.8. LDL not at goal 91. Neurology is following and recommends Plavix 75 mg daily for 21 days and Aspirin 81 mg daily, then after 21 days ASA only thereafter. They also recommend Rosuvastatin 10 mg daily. TEE pending and scheduled for 11/11. Resuming home losartan-hydrochlorothiazide  today to allow for gradual BP reduction with Neurology managing.   - Neurology follow, appreciate assistance   - f/u TEE on 11/12 - Frequent neuro checks - 11/11: gradual BP reduction: resume home Losartan 50 mg daily and hydrochlorothiazide  12.5 mg daily  - Prophylactic therapy:   - Plavix 75 mg daily for 21 days and Aspirin 81 mg daily, then after 21 days ASA only thereafter   - Aspirin 81 mg daily  - Rosuvastatin 10 mg daily  - Risk factor modification - Telemetry monitoring for possible arrhythmia  - PT consult, OT consult, Speech consult: no follow up recommended  - Stroke team to follow - Tylenol  650 mg q6 hrs prn   - Continue cocaine cessation counseling    #AKI Baseline Cr ~ 0.9. Cr today of 1.26, BUN 17, and eGFR 54. Will encourage po water  intake and monitor for resolution.  - BMP  - encourage po intake - Avoid nephrotoxins  - Preferred narcotic agents for pain control are hydromorphone , fentanyl, and methadone. Morphine  should not be used.  - Baclofen should be avoided - Avoid oral sodium phosphate and magnesium citrate based laxatives / bowel preps  - Avoid contrast - Avoid NSAIDs  #HTN Patient has a history of hypertension and takes home losartan-hydrochlorothiazide  50-12.5 mg daily.  She reports that she last took this medication on Wednesday 11/5, but then ran out.  She reports  this morning home SBP of 200s.  Last known normal was Friday 11/7 at 2200, we will plan to allow for massive hypertension and follow Neuro recommendations on gradual BP reduction.  - Resume home losartan-hydrochlorothiazide  50-12.5 mg daily - Patient has been resumed on hydrochlorothiazide , will monitor potassium and replenish as needed  - 11/11: KCl 40 mEq x2 doses given    #HLD Patient takes home rosuvastatin 5 mg daily. Goal LDL < 70. LDL elevated at 91. Neurology recommends Rosuvastatin 10 mg daily.  - f/u lipid panel -  Rosuvastatin 10 mg daily    #Cocaine Use Disorder #Tobacco Use Disorder  #Marijuana Use Have discussed cessation of substances with patient. Continue to recommend cessation counseling outpatient.  - TOC consult  #Macrocytic Anemia, lab  error on admission  Initially presented with Hgb of 8.7. MCV 101. Vitamin B12, Folate and iron studies wnl. On repeat CBC Hgb has been wnl in 13s. Suspect initial lab was an error.     Best Practice: Diet: Regular diet VTE: enoxaparin (LOVENOX) injection 40 mg Start: 03/08/24 2200 Code: Full  Disposition planning: Therapy Recs: None, DME: none DISPO: Anticipated discharge tomorrow to Home pending stroke work up and TEE.  Signature:  Doyal Miyamoto, MD [facility]  7:04 AM, 03/10/2024  On Call pager [phone]

## 2024-03-10 NOTE — Plan of Care (Signed)
  Problem: Education: Goal: Knowledge of General Education information will improve Description: Including pain rating scale, medication(s)/side effects and non-pharmacologic comfort measures Outcome: Not Progressing   Problem: Health Behavior/Discharge Planning: Goal: Ability to manage health-related needs will improve Outcome: Not Progressing   Problem: Clinical Measurements: Goal: Ability to maintain clinical measurements within normal limits will improve Outcome: Not Progressing Goal: Will remain free from infection Outcome: Not Progressing Goal: Diagnostic test results will improve Outcome: Not Progressing Goal: Respiratory complications will improve Outcome: Not Progressing Goal: Cardiovascular complication will be avoided Outcome: Not Progressing   Problem: Nutrition: Goal: Adequate nutrition will be maintained Outcome: Not Progressing   Problem: Coping: Goal: Level of anxiety will decrease Outcome: Not Progressing   Problem: Elimination: Goal: Will not experience complications related to bowel motility Outcome: Not Progressing Goal: Will not experience complications related to urinary retention Outcome: Not Progressing   Problem: Pain Managment: Goal: General experience of comfort will improve and/or be controlled Outcome: Not Progressing   Problem: Safety: Goal: Ability to remain free from injury will improve Outcome: Not Progressing   Problem: Skin Integrity: Goal: Risk for impaired skin integrity will decrease Outcome: Not Progressing

## 2024-03-10 NOTE — TOC Transition Note (Addendum)
 Transition of Care [facility]) - Discharge Note   Patient Details  Name: Debra Obrien MRN: [id number] Date of Birth: 1980/07/05  [facility] CM/SW Contact:  Landry DELENA Senters, RN Phone Number: 03/10/2024, 10:59 AM   Clinical Narrative:     Patient lives at home with 44 yo son, patient reports she does have family that is able to help with some needs, but does not have consistent support. Patient manages own medications, has PCP, reports using [facility] if she is not able to drive. Patient is interested in getting transportation set up through her Medicaid. CM will place info on AVS. CAGE AID completed with patient on cocaine use. Patient denies the need for resources but did accept some counseling on outpatient services. CM gave patient paper handout for outpatient resources.   11:34 - Notified by ST patient needs some outpatient cognitive therapy. Referral sent to [facility]. Information on AVS and patient will call to schedule first appt.   Final next level of care: Home/Self Care Barriers to Discharge: No Barriers Identified   Patient Goals and CMS Choice            Discharge Placement                       Discharge Plan and Services Additional resources added to the After Visit Summary for                                       Social Drivers of Health (SDOH) Interventions SDOH Screenings   Food Insecurity: No Food Insecurity (03/08/2024)  Housing: Low Risk  (03/08/2024)  Transportation Needs: No Transportation Needs (03/08/2024)  Utilities: Not At Risk (03/08/2024)  Depression (PHQ2-9): Low Risk  (02/09/2019)  Tobacco Use: High Risk (03/09/2024)     Readmission Risk Interventions     No data to display

## 2024-03-11 ENCOUNTER — Encounter (HOSPITAL_COMMUNITY)
Admission: EM | Disposition: A | Discharge status: Home / Self Care | Payer: Self-pay | Source: Home / Self Care | Attending: Internal Medicine

## 2024-03-11 ENCOUNTER — Inpatient Hospital Stay (HOSPITAL_COMMUNITY): Payer: Self-pay | Admitting: Anesthesiology

## 2024-03-11 ENCOUNTER — Encounter (HOSPITAL_COMMUNITY): Payer: Self-pay | Admitting: Internal Medicine

## 2024-03-11 ENCOUNTER — Other Ambulatory Visit (HOSPITAL_COMMUNITY): Payer: Self-pay

## 2024-03-11 ENCOUNTER — Inpatient Hospital Stay (HOSPITAL_COMMUNITY)

## 2024-03-11 DIAGNOSIS — R29700 NIHSS score 0: Secondary | ICD-10-CM | POA: Diagnosis not present

## 2024-03-11 DIAGNOSIS — I63311 Cerebral infarction due to thrombosis of right middle cerebral artery: Secondary | ICD-10-CM | POA: Diagnosis not present

## 2024-03-11 DIAGNOSIS — F1210 Cannabis abuse, uncomplicated: Secondary | ICD-10-CM | POA: Diagnosis not present

## 2024-03-11 DIAGNOSIS — I6389 Other cerebral infarction: Secondary | ICD-10-CM | POA: Diagnosis not present

## 2024-03-11 DIAGNOSIS — I10 Essential (primary) hypertension: Secondary | ICD-10-CM

## 2024-03-11 DIAGNOSIS — I639 Cerebral infarction, unspecified: Secondary | ICD-10-CM

## 2024-03-11 DIAGNOSIS — F1410 Cocaine abuse, uncomplicated: Secondary | ICD-10-CM | POA: Diagnosis not present

## 2024-03-11 DIAGNOSIS — F17210 Nicotine dependence, cigarettes, uncomplicated: Secondary | ICD-10-CM

## 2024-03-11 HISTORY — PX: TRANSESOPHAGEAL ECHOCARDIOGRAM (CATH LAB): EP1270

## 2024-03-11 LAB — CBC
HCT: 41.6 % (ref 36.0–46.0)
Hemoglobin: 13.7 g/dL (ref 12.0–15.0)
MCH: 30.4 pg (ref 26.0–34.0)
MCHC: 32.9 g/dL (ref 30.0–36.0)
MCV: 92.2 fL (ref 80.0–100.0)
Platelets: 369 K/uL (ref 150–400)
RBC: 4.51 MIL/uL (ref 3.87–5.11)
RDW: 12.7 % (ref 11.5–15.5)
WBC: 10.5 K/uL (ref 4.0–10.5)
nRBC: 0 % (ref 0.0–0.2)

## 2024-03-11 LAB — BASIC METABOLIC PANEL WITH GFR
Anion gap: 10 (ref 5–15)
BUN: 20 mg/dL (ref 6–20)
CO2: 25 mmol/L (ref 22–32)
Calcium: 9 mg/dL (ref 8.9–10.3)
Chloride: 100 mmol/L (ref 98–111)
Creatinine, Ser: 1.15 mg/dL — ABNORMAL HIGH (ref 0.44–1.00)
GFR, Estimated: >60 mL/min (ref >60)
Glucose, Bld: 119 mg/dL — ABNORMAL HIGH (ref 70–99)
Potassium: 3.7 mmol/L (ref 3.5–5.1)
Sodium: 135 mmol/L (ref 135–145)

## 2024-03-11 LAB — ECHO TEE

## 2024-03-11 SURGERY — TRANSESOPHAGEAL ECHOCARDIOGRAM (TEE) (CATHLAB)
Anesthesia: Monitor Anesthesia Care

## 2024-03-11 MED ORDER — CLOPIDOGREL BISULFATE 75 MG PO TABS
75.0000 mg | ORAL_TABLET | Freq: Every day | ORAL | 0 refills | Status: AC
  Filled 2024-03-11: qty 17, 17d supply, fill #0

## 2024-03-11 MED ORDER — ASPIRIN 81 MG PO TBEC
81.0000 mg | DELAYED_RELEASE_TABLET | Freq: Every day | ORAL | 0 refills | Status: AC
  Filled 2024-03-11: qty 30, 30d supply, fill #0

## 2024-03-11 MED ORDER — PROPOFOL 10 MG/ML IV BOLUS
INTRAVENOUS | Status: DC | PRN
  Administered 2024-03-11: 20 mg via INTRAVENOUS

## 2024-03-11 MED ORDER — ROSUVASTATIN CALCIUM 10 MG PO TABS
10.0000 mg | ORAL_TABLET | Freq: Every day | ORAL | 0 refills | Status: AC
Start: 2024-03-12 — End: ?
  Filled 2024-03-11: qty 30, 30d supply, fill #0

## 2024-03-11 MED ORDER — SODIUM CHLORIDE 0.9 % IV SOLN: INTRAVENOUS | Status: DC

## 2024-03-11 MED ORDER — PHENYLEPHRINE HCL-NACL 20-0.9 MG/250ML-% IV SOLN
INTRAVENOUS | Status: DC | PRN
Start: 2024-03-11 — End: 2024-03-11
  Administered 2024-03-11: 50 ug/min via INTRAVENOUS

## 2024-03-11 MED ORDER — PROPOFOL 500 MG/50ML IV EMUL
INTRAVENOUS | Status: DC | PRN
Start: 2024-03-11 — End: 2024-03-11
  Administered 2024-03-11: 120 ug/kg/min via INTRAVENOUS

## 2024-03-11 MED ORDER — PHENYLEPHRINE 80 MCG/ML (10ML) SYRINGE FOR IV PUSH (FOR BLOOD PRESSURE SUPPORT)
PREFILLED_SYRINGE | INTRAVENOUS | Status: DC | PRN
  Administered 2024-03-11 (×2): 160 ug via INTRAVENOUS

## 2024-03-11 MED ORDER — LIDOCAINE 2% (20 MG/ML) 5 ML SYRINGE
INTRAMUSCULAR | Status: DC | PRN
  Administered 2024-03-11: 60 mg via INTRAVENOUS

## 2024-03-11 NOTE — Interval H&P Note (Signed)
 History and Physical Interval Note:  03/11/2024 7:53 AM  Debra Obrien  has presented today for surgery, with the diagnosis of stroke.  The various methods of treatment have been discussed with the patient and family. After consideration of risks, benefits and other options for treatment, the patient has consented to  Procedure(s): TRANSESOPHAGEAL ECHOCARDIOGRAM (N/A) as a surgical intervention.  The patient's history has been reviewed, patient examined, no change in status, stable for surgery.  I have reviewed the patient's chart and labs.  Questions were answered to the patient's satisfaction.     Lucylle Foulkes

## 2024-03-11 NOTE — Discharge Instructions (Addendum)
 To [name] or their caretakers,  They were admitted to [facility] on 03/08/2024 for evaluation and treatment of: dizziness and weakness     The evaluation suggested a new subacute stroke in the right side of your brain.   They were discharged from the hospital on 03/11/24. I recommend the following after leaving the hospital:   Medications Adjusted During Hospital Admission:  1) TAKE Aspirin 81 mg daily (this medication will be taken for the rest of your life)  2) TAKE Plavix (Clopidegrel) 75 mg daily for 17 more days (a total of 21 days from the day we started you on it in the hospital). After 21 days, you will discontinue Plavix.   3) TAKE Rosuvastatin (Crestor) 10 mg daily   4) TAKE Losartan-hydrochlorothiazide  (Hyzaar) 50-12.5 mg daily   Begin all of the above medications on 03/12/2024, as you received all of the medications on the day of your discharge.   Please keep a daily log of your blood pressure readings. Take your blood pressure twice daily and keep a detailed written log to bring to your PCP appointment.    If you do have a mild-moderate headache, take Tylenol  according to the instructions on the label. Avoid NSAIDs (ibuprofen , naproxen ) as you now taking Aspirin daily.  You may obtain Aspirin 81 mg over the counter.    Please make sure you make follow up appointments as follows:  Follow-up Information     [facility]. Call.   Why: Please call [facility] 2-3 days in advance to schedule transportation. Contact information: [phone]        [facility]. Schedule an appointment as soon as possible for a visit.   Specialty: Rehabilitation Contact information: [address] [place]  [address] [phone]        [facility]. Schedule an appointment as soon as possible for a visit in 1 month(s).   Specialty: Neurology Why: stroke clinic Contact  information: [address] [place]  [phone]        [facility] Schedule an appointment as soon as possible for a visit.   Why: Please schedule an appointment with your PCP to be seen in 1-2 weeks following discharge from the hospital. Contact information: [address] [place]  [address] [phone]                   For questions about your [facility] plan, until you are able to see your primary doctor: Call [phone]. Dial 0 for the operator. Ask for the internal medicine resident on call.  Thank you for allowing us  to be part of your [facility].   Doyal Miyamoto, MD 03/11/2024, 1:19 PM

## 2024-03-11 NOTE — Progress Notes (Signed)
 Communicated with patient via Caregility. Patient appears comfortable in bed. AVS paperwork given to the patient. Discharge instructions discussed. Patient's question answered to satisfaction. Patient agreeable with discharge plan.

## 2024-03-11 NOTE — Progress Notes (Addendum)
 STROKE TEAM PROGRESS NOTE   INTERIM HISTORY/SUBJECTIVE Daughter is at the bedside. Pt lying in bed, stated that last night she went to bathroom felt R leg mild weakness and L ankle pain. Will have PT come back to see her again today. TEE unremarkable.   OBJECTIVE  CBC    Component Value Date/Time   WBC 10.5 03/11/2024 0130   RBC 4.51 03/11/2024 0130   HGB 13.7 03/11/2024 0130   HGB 11.9 (L) 11/07/2012 2337   HCT 41.6 03/11/2024 0130   HCT 34.8 (L) 11/07/2012 2337   PLT 369 03/11/2024 0130   PLT 325 11/07/2012 2337   MCV 92.2 03/11/2024 0130   MCV 90 11/07/2012 2337   MCH 30.4 03/11/2024 0130   MCHC 32.9 03/11/2024 0130   RDW 12.7 03/11/2024 0130   RDW 13.1 11/07/2012 2337   LYMPHSABS 3.2 05/07/2020 1202   LYMPHSABS 1.6 05/01/2011 1141   MONOABS 0.4 05/07/2020 1202   MONOABS 0.3 05/01/2011 1141   EOSABS 0.3 05/07/2020 1202   EOSABS 0.0 05/01/2011 1141   BASOSABS 0.0 05/07/2020 1202   BASOSABS 0.0 05/01/2011 1141    BMET    Component Value Date/Time   NA 135 03/11/2024 0130   NA 140 04/24/2012 1510   K 3.7 03/11/2024 0130   K 3.1 (L) 04/24/2012 1510   CL 100 03/11/2024 0130   CL 106 04/24/2012 1510   CO2 25 03/11/2024 0130   CO2 27 04/24/2012 1510   GLUCOSE 119 (H) 03/11/2024 0130   GLUCOSE 87 04/24/2012 1510   BUN 20 03/11/2024 0130   BUN 9 04/24/2012 1510   CREATININE 1.15 (H) 03/11/2024 0130   CREATININE 0.82 04/24/2012 1510   CALCIUM 9.0 03/11/2024 0130   CALCIUM 8.6 04/24/2012 1510   GFRNONAA >60 03/11/2024 0130   GFRNONAA >60 04/24/2012 1510    IMAGING past 24 hours CT ANGIO HEAD NECK W WO CM  IMPRESSION: 1. No large vessel occlusion, hemodynamically significant stenosis, or aneurysm in the head or neck. 2. 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 3. Mild atherosclerosis at the common origin of the brachiocephalic and left common carotid arteries without stenosis. 4. Enlarged bilateral level  1b cervical lymph nodes measuring up to 1.1 cm in short axis; recommend clinical follow-up for resolution.  MR BRAIN IMPRESSION: 1. Signal abnormality and mild edema in the right temporoparietal lobes, likely reflecting subacute infarct. Recommend follow up MRI in 3 months to document resolution.  Vitals:   03/11/24 0930 03/11/24 0935 03/11/24 0940 03/11/24 0950  BP: (!) 96/53 (!) 100/55 112/71 113/74  Pulse: (!) 104 100 97 (!) 105  Resp: 16 13 20 16   Temp:      TempSrc:      SpO2: 97% 98% 97% 99%  Weight:      Height:         PHYSICAL EXAM General:  Alert, well-nourished, well-developed patient in no acute distress Psych:  Mood and affect appropriate for situation CV: Regular rate and rhythm on monitor Respiratory:  Regular, unlabored respirations on room air GI: Abdomen soft and nontender   NEURO:  Mental Status: AA&Ox3, patient is able to give clear and coherent history Speech/Language: speech is without dysarthria or aphasia.  Naming, repetition, fluency, and comprehension intact.  Cranial Nerves:  II: PERRL. Visual fields full.  III, IV, VI: EOMI. Eyelids elevate symmetrically.  V: Sensation is intact to light touch and symmetrical to face.  VII: Face is symmetrical resting and smiling VIII: hearing  intact to voice. IX, X: Palate elevates symmetrically. Phonation is normal.  KP:Dynloizm shrug 5/5. XII: tongue is midline without fasciculations. Motor: 5/5 strength to all muscle groups tested.  Tone: is normal and bulk is normal Sensation- Intact to light touch bilaterally. Extinction absent to light touch to DSS.   Coordination: FTN intact bilaterally, HKS: no ataxia in BLE.No drift.  Gait- deferred  Most Recent NIH 0    ASSESSMENT/PLAN  Ms. KAISHA WACHOB is a 43 y.o. female with history of HTN, smoker, marijuana use, HLD, who presents with headache, swimmy feeling in her head and generalized weakness.  NIH on Admission 0  Subacute infarct:  right  temporoparietal lobe infarct, appears subacute  Etiology:  cryptogenic, but could be related to multiple substance abuse   Code Stroke CT head - Subacute appearing posterior right MCA territory infarct, no hemorrhagic transformation or mass effect. CTA head & neck -unremarkable MRI with and without contrast Signal abnormality and mild edema in the right temporoparietal lobes, likely reflecting subacute infarct Repeat MRI in 2-3 months to make sure the evolution fits subacute infarct TCD Bubble no PFO Venous duplex no DVT 2D Echo EF 60-65%, bubble study negative  TEE unremarkable, no PFO or thrombus LDL 91 HgbA1c 5.8 VTE prophylaxis - lovenox  No antithrombotic prior to admission, now on aspirin 81 mg daily and clopidogrel 75 mg daily for 3 weeks and then aspirin alone. Therapy recommendations:  pending reeval Disposition:  pending  ? Seizure episode Presented to ED for episode of foggy headed, speech difficulty Symptoms lasting briefly EEG normal No AEDs needed at this time  Hypertension Home meds:  Hyzaar Stable on the high end Restart home BP meds tomorrow Long-term BP goal normotensive  Hyperlipidemia Home meds:  Crestor 5 LDL 91, goal < 70 Now on Crestor 10 Continue statin at discharge  Cocaine abuse UDS positive for cocaine Cessation education provided Patient is willing to quit  Tobacco abuse Current smoker Smoking cessation counseling provided Pt is willing to quit  THC user Long term THC user Cessation education provided Pt is willing to quit  Other Stroke Risk Factors   Hospital day # 2  Neurology will sign off. Please call with questions. Pt will follow up with stroke clinic NP at [facility] in about 4 weeks. Thanks for the consult.   Ary Cummins, MD PhD Stroke Neurology 03/11/2024 11:23 AM    To contact Stroke Continuity provider, please refer to [email or web address]. After hours, contact General Neurology

## 2024-03-11 NOTE — Progress Notes (Shared)
 HD#2 SUBJECTIVE:  Patient Summary: Debra [facility]rien is a 43 y.o. female with a history of HTN, PID, and substance use (Marijuana, cocaine, cigarettes), who is admitted for subacute MCA infarct and stroke work up.   Overnight Events:  None  Interim History:  ***   OBJECTIVE:  Vital Signs: Vitals:   03/10/24 1605 03/10/24 2019 03/11/24 0019 03/11/24 0330  BP: (!) 182/88 (!) 165/79 (!) 167/92 137/86  Pulse: 87 91 88 92  Resp: 18 17 18 18   Temp: 98.2 F (36.8 C) 97.6 F (36.4 C) 97.8 F (36.6 C) 98.4 F (36.9 C)  TempSrc: Oral Oral Oral   SpO2: 98% 91% 99% 91%  Weight:      Height:       Supplemental O2: Room Air SpO2: 91 %  Filed Weights   03/09/24 2224  Weight: 131.3 kg     Intake/Output Summary (Last 24 hours) at 03/11/2024 9266 Last data filed at 03/11/2024 9360 Gross per 24 hour  Intake 240 ml  Output --  Net 240 ml   Net IO Since Admission: 360 mL [03/11/24 0733]  Physical Exam:  Constitutional: well-appearing female sitting in hospital bed, in no acute distress HEENT: normocephalic atraumatic, mucous membranes moist Cardiovascular: regular rate and rhythm, bilateral radial pulses 2+, bilateral dorsal pedal pulses 2+, brisk capillary refill bilateral feet and hands  Pulmonary/Chest: normal work of breathing on room air, lungs clear to auscultation bilaterally Abdominal: soft, non-tender, non-distended MSK: normal bulk and tone. Neurological:  Neuro: *MS: A&O x4. Follows multi-step commands.  *Speech: no dysarthria or aphasia, able to name and repeat. *CN:    I: Deferred   II,III: PERRLA, VFF by confrontation, optic discs not visualized 2/2 pupillary constriction   III,IV,VI: EOMI w/o nystagmus, no ptosis   V: Sensation intact from V1 to V3 to LT   VII: Eyelid closure was full.  Smile symmetric.   VIII: Hearing intact to voice   IX,X: Voice normal   XI: SCM/trap 5/5 bilat   XII: Tongue protrudes midline, no atrophy or fasciculations  *Motor:    Normal bulk.  No tremor, rigidity or bradykinesia. No pronator drift.    Strength: Dlt Bic Tri WE WrF FgS Gr HF KnF KnE PlF DoF    Left 5 5 5 5 5 5 5 5 5 5 5 5     Right 5 5 5 5 5 5 5 5 5 5 5 5     *Sensory: Intact to light touch throughout. Symmetric.  *Coordination:  Heel-to-shin intact. *Gait: deferred    Skin: warm and dry Psych: mood calm, behavior normal, thought content normal, judgement normal    Patient Lines/Drains/Airways Status     Active Line/Drains/Airways     Name Placement date Placement time Site Days   Peripheral IV 03/08/24 20 G Left;Posterior Hand 03/08/24  1003  Hand  1   Peripheral IV 03/08/24 20 G Anterior;Proximal;Right Forearm 03/08/24  1153  Forearm  1            Pertinent labs and imaging:      Latest Ref Rng & Units 03/11/2024    1:30 AM 03/10/2024    1:20 AM 03/09/2024    1:35 AM  CBC  WBC 4.0 - 10.5 K/uL 10.5  9.6  8.9   Hemoglobin 12.0 - 15.0 g/dL 86.2  86.5  86.3   Hematocrit 36.0 - 46.0 % 41.6  40.6  41.3   Platelets 150 - 400 K/uL 369  366  374  Latest Ref Rng & Units 03/11/2024    1:30 AM 03/10/2024    1:20 AM 03/09/2024    1:35 AM  CMP  Glucose 70 - 99 mg/dL 880  879  880   BUN 6 - 20 mg/dL 20  17  12    Creatinine 0.44 - 1.00 mg/dL 8.84  8.73  9.05   Sodium 135 - 145 mmol/L 135  136  135   Potassium 3.5 - 5.1 mmol/L 3.7  3.3  3.4   Chloride 98 - 111 mmol/L 100  100  97   CO2 22 - 32 mmol/L 25  27  26    Calcium 8.9 - 10.3 mg/dL 9.0  8.5  9.1     CT ANGIO HEAD NECK W WO CM Result Date: 03/08/2024 EXAM: CTA HEAD AND NECK WITHOUT AND WITH 03/08/2024 05:23:21 PM TECHNIQUE: CTA of the head and neck was performed without and with the administration of 75 mL of intravenous iohexol  (OMNIPAQUE ) 350 MG/ML injection. Multiplanar 2D and/or 3D reformatted images are provided for review. Automated exposure control, iterative reconstruction, and/or weight based adjustment of the mA/kV was utilized to reduce the radiation dose to as  low as reasonably achievable. Stenosis of the internal carotid arteries measured using NASCET criteria. COMPARISON: Same day CT head and MRI head. CLINICAL HISTORY: Neuro deficit, acute, stroke suspected. FINDINGS: CTA NECK: AORTIC ARCH AND ARCH VESSELS: Common origin of the brachiocephalic and left common carotid arteries. There is mild atherosclerosis at the common origin without stenosis. The subclavian arteries are patent bilaterally. No dissection or arterial injury. CERVICAL CAROTID ARTERIES: The right carotid artery is patent from the origin to the skull base; there is no hemodynamically significant stenosis. The left carotid artery is patent from the origin to the skull base; there is no hemodynamically significant stenosis. No dissection or arterial injury. CERVICAL VERTEBRAL ARTERIES: The vertebral arteries are patent from the origins to the vertebrobasilar confluence. Mild tortuosity of the bilateral V2 segments. No dissection, arterial injury, or significant stenosis. LUNGS AND MEDIASTINUM: Unremarkable. SOFT TISSUES: There are enlarged bilateral level 1b cervical nodes measuring up to 1.1 cm in short axis. Recommend clinical follow up to resolution. BONES: No acute abnormality. CTA HEAD: ANTERIOR CIRCULATION: The intracranial internal carotid arteries are patent bilaterally. There is atherosclerosis involving the bilateral cavernous ICAs with mild stenosis and atherosclerotic plaque bilaterally. The middle cerebral arteries are patent bilaterally. The anterior cerebral arteries are patent bilaterally. No aneurysm. POSTERIOR CIRCULATION: The vertebral arteries are patent from the origins to the vertebrobasilar confluence. No significant stenosis of the posterior cerebral arteries. No significant stenosis of the basilar artery. No aneurysm. OTHER: No dural venous sinus thrombosis on this non-dedicated study. IMPRESSION: 1. No large vessel occlusion, hemodynamically significant stenosis, or aneurysm in the  head or neck. 2. 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 3. Mild atherosclerosis at the common origin of the brachiocephalic and left common carotid arteries without stenosis. 4. Enlarged bilateral level 1b cervical lymph nodes measuring up to 1.1 cm in short axis; recommend clinical follow-up for resolution. Electronically signed by: Donnice Mania MD 03/08/2024 06:11 PM EST RP Workstation: [id number]   MR BRAIN WO CONTRAST Result Date: 03/08/2024 EXAM: MRI BRAIN WITHOUT CONTRAST 03/08/2024 05:17:43 PM TECHNIQUE: Multiplanar multisequence MRI of the head/brain was performed without the administration of intravenous contrast. COMPARISON: Same day CT head. CLINICAL HISTORY: Headache, neuro deficit. FINDINGS: BRAIN AND VENTRICLES: No acute infarct. No intracranial hemorrhage. No mass. No midline shift. No hydrocephalus. There  is T2/FLAIR hyperintensity involving the posterior right temporal lobe extending into the inferior right parietal lobe with mild gyral expansion and suggestion of edema within this region. There is prominent FLAIR hyperintensity of the cortex of the right temporoparietal lobes within this region associated diffusion signal abnormality likely reflecting T2 shine through. There is no restricted diffusion to suggest acute infarct. There are areas of intrinsic T1 hyperintensity along the cortex within the right temporoparietal lobes with possible subtle susceptibility which may reflect areas of cortical laminar necrosis. The sella is unremarkable. Normal flow voids. ORBITS: No acute abnormality. SINUSES AND MASTOIDS: Mucosal thickening in the ethmoid sinuses. Trace fluid in the mastoid air cells. BONES AND SOFT TISSUES: Normal marrow signal. No acute soft tissue abnormality. IMPRESSION: 1. Signal abnormality and mild edema in the right temporoparietal lobes, likely reflecting subacute infarct. Recommend follow up MRI in 3 months to document  resolution. Electronically signed by: Donnice Mania MD 03/08/2024 05:59 PM EST RP Workstation: [id number]   CT Head Wo Contrast Addendum Date: 03/08/2024 ** ADDENDUM #1 ** ADDENDUM: Study discussed by telephone with Dr. Laurice at 11:21 hours on March 08, 2024. ---------------------------------------------------- Electronically signed by: Helayne Hurst MD 03/08/2024 11:26 AM EST RP Workstation: [id number]   Result Date: 03/08/2024 **ORIGINAL REPORT **EXAM: CT HEAD WITHOUT CONTRAST 03/08/2024 10:52:36 AM TECHNIQUE: CT of the head was performed without the administration of intravenous contrast. Automated exposure control, iterative reconstruction, and/or weight based adjustment of the mA/kV was utilized to reduce the radiation dose to as low as reasonably achievable. COMPARISON: Head CT 05/07/2020. CLINICAL HISTORY: 43 year old female with headache and neuro deficit. FINDINGS: BRAIN AND VENTRICLES: No acute hemorrhage. Confluent new hypodensity tracking from the posterior right temporal lobe into the lateral right parietal lobe (series 2 images 13 through 18). No regional mass effect. But On coronal image 31 this does NOT appear to be chronic encephalomalacia. No hemorrhagic transformation. Background brain volume remains normal. Gray white differentiation is stable and within normal limits. No ventriculomegaly. Normal basilar cisterns. No extra-axial collection. No mass effect or midline shift. No suspicious intracranial vascular hyperdensity. Calcified atherosclerosis at the skull base. ORBITS: No acute abnormality. SINUSES: Visible paranasal sinuses, middle ears and mastoids remain well aerated. SOFT TISSUES AND SKULL: No acute soft tissue abnormality. No skull fracture. IMPRESSION: 1. 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 Electronically signed by: Helayne Hurst MD 03/08/2024 11:03 AM EST RP Workstation: [id number]   DG Chest Portable 1  View Result Date: 03/08/2024 EXAM: 1 VIEW(S) XRAY OF THE CHEST 03/08/2024 10:45:00 AM COMPARISON: Chest radiographs dated 04/24/2012. CLINICAL HISTORY: chest pain FINDINGS: LUNGS AND PLEURA: When allowing for portable technique: No focal pulmonary opacity. No pulmonary edema. No pleural effusion. No pneumothorax. HEART AND MEDIASTINUM: Borderline to mild cardiomegaly appears chronic and stable. No acute abnormality of the mediastinal silhouette. BONES AND SOFT TISSUES: No acute osseous abnormality. IMPRESSION: 1. No acute cardiopulmonary process. Electronically signed by: Helayne Hurst MD 03/08/2024 11:04 AM EST RP Workstation: [id number]    ASSESSMENT/PLAN:  Assessment: Principal Problem:   Acute cerebral infarction [facility]) Active Problems:   Hypertensive emergency   Cerebrovascular accident (CVA) due to thrombosis of right middle cerebral artery (HCC)  KERIANNE GURR is a 43 y.o. female with a history of HTN, PID, and substance use (Marijuana, cocaine, cigarettes), who presents with complaints of dizziness and weakness/fatigue since Saturday 11/8 morning, and admitted for subacute MCA infarct and stroke work up.   Plan: ##CVA: Subacute posterior right MCA infarct #  Cocaine Use Patient presents with complaints of dizziness and weakness since Saturday 11/8 AM.  Last known normal was Friday 11/7 at 2200 before patient went to bed. CT head showed subacute posterior right MCA territory infarct with no hemorrhagic transformation or mass effect. On physical exam, no focal deficits noted, strength 5/5 in all extremities. No headache during interview. She has a history of difficult to control hypertension. She also notes her last cocaine use was on Wednesday 11/5 as well, though AM UDS shows cocaine present so may have used more recently than her report. Echo with bubble showed EF of 60-65% and bubble study negative. Transcranial doppler w/ bubble negative. Bilateral LE doppler U/S negative for DVT. A1C of  5.8. LDL not at goal 91. Neurology is following and recommends Plavix 75 mg daily for 21 days and Aspirin 81 mg daily, then after 21 days ASA only thereafter. They also recommend Rosuvastatin 10 mg daily. TEE pending and scheduled for 11/11. Resuming home losartan-hydrochlorothiazide  today to allow for gradual BP reduction with Neurology managing.   - Neurology follow, appreciate assistance   - f/u TEE on 11/12 - Frequent neuro checks - 11/11: gradual BP reduction: resume home Losartan 50 mg daily and hydrochlorothiazide  12.5 mg daily  - Prophylactic therapy:   - Plavix 75 mg daily for 21 days and Aspirin 81 mg daily, then after 21 days ASA only thereafter   - Aspirin 81 mg daily  - Rosuvastatin 10 mg daily  - Risk factor modification - Telemetry monitoring for possible arrhythmia  - PT consult, OT consult, Speech consult: no follow up recommended  - Stroke team to follow - Tylenol  650 mg q6 hrs prn   - Continue cocaine cessation counseling    #AKI Baseline Cr ~ 0.9. Cr today of 1.15, BUN 20, and eGFR > 60. Will encourage po water  intake and monitor for resolution.  - BMP  - encourage po intake - Avoid nephrotoxins  - Preferred narcotic agents for pain control are hydromorphone , fentanyl, and methadone. Morphine  should not be used.  - Baclofen should be avoided - Avoid oral sodium phosphate and magnesium citrate based laxatives / bowel preps  - Avoid contrast - Avoid NSAIDs  #HTN Patient has a history of hypertension and takes home losartan-hydrochlorothiazide  50-12.5 mg daily.  She reports that she last took this medication on Wednesday 11/5, but then ran out.  She reports this morning home SBP of 200s.  Last known normal was Friday 11/7 at 2200, we will plan to allow for massive hypertension and follow Neuro recommendations on gradual BP reduction.  - Resume home losartan-hydrochlorothiazide  50-12.5 mg daily - Patient has been resumed on hydrochlorothiazide , will monitor potassium and  replenish as needed - Recommend obtaining BP cuff and making a BP log at home   #HLD Patient takes home rosuvastatin 5 mg daily. Goal LDL < 70. LDL elevated at 91. Neurology recommends Rosuvastatin 10 mg daily.  - Rosuvastatin 10 mg daily  - Recommend uptitration to high intensity if tolerating 10 mg dosing well    #Cocaine Use Disorder #Tobacco Use Disorder  #Marijuana Use Have discussed cessation of substances with patient. Continue to recommend cessation counseling outpatient.  - TOC consult  #Macrocytic Anemia, lab error on admission  Initially presented with Hgb of 8.7. MCV 101. Vitamin B12, Folate and iron studies wnl. On repeat CBC Hgb has been wnl in 13s. Suspect initial lab was an error.     Best Practice: Diet: Regular diet VTE: enoxaparin (LOVENOX) injection  40 mg Start: 03/08/24 2200 Code: Full  Disposition planning: Therapy Recs: None, DME: none DISPO: Anticipated discharge tomorrow to Home pending stroke work up and TEE.  Signature:  Doyal Miyamoto, MD [facility]  7:33 AM, 03/11/2024  On Call pager [phone]

## 2024-03-11 NOTE — Progress Notes (Signed)
 Physical Therapy Treatment Patient Details Name: Debra Obrien MRN: [id number] DOB: 09/29/1980 Today's Date: 03/11/2024   History of Present Illness The pt is a 43 yo female presenting 11/9 with dizziness for a few days, recently ran out of BP meds. 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 PMH includes: HTN, HLD, PID, substance use ((Marijuana, cocaine, cigarettes).    PT Comments  Pt received in supine and agreeable to session. Pt reports RLE shaking during transfers to the bathroom since last night, but no shaking noticed during session. Pt requests to use AD for improved stability due to weakness and L heel pain. Pt reports heel pain for the past 3 months and has been walking on her L toes. Pt reports preference of SPC and demonstrates good management and stability with use. Pt limited by fatigue and shortness of breath requiring seated rest break. Pt able to tolerate increased gait distance and stair trial with CGA for safety. Education provided on activity progression, reducing fall risk, and energy conservation. Acutely, pt continues to benefit from PT services to progress toward functional mobility goals.    If plan is discharge home, recommend the following: Help with stairs or ramp for entrance   Can travel by private vehicle        Equipment Recommendations  Cane    Recommendations for Other Services       Precautions / Restrictions Precautions Precautions: Fall Recall of Precautions/Restrictions: Intact Precaution/Restrictions Comments: watch BP (HTN) Restrictions Weight Bearing Restrictions Per Provider Order: No     Mobility  Bed Mobility Overal bed mobility: Independent                  Transfers Overall transfer level: Independent                      Ambulation/Gait Ambulation/Gait assistance: Contact guard assist Gait Distance (Feet): 100 Feet (x2) Assistive device: Straight cane Gait  Pattern/deviations: Step-through pattern, Decreased stride length, Decreased weight shift to left, Decreased stance time - left, Decreased step length - right, Antalgic Gait velocity: decreased     General Gait Details: Pt demonstrates slow gait with reliance on SPC to offload LLE. Pt noted to raise heel during LLE WB. cues for sequencing with cane   Stairs Stairs: Yes Stairs assistance: Contact guard assist Stair Management: With cane, One rail Left Number of Stairs: 2 (x2) General stair comments: cues for sequencing and technique. CGA for safety   Wheelchair Mobility     Tilt Bed    Modified Rankin (Stroke Patients Only) Modified Rankin (Stroke Patients Only) Pre-Morbid Rankin Score: No symptoms Modified Rankin: Moderate disability     Balance Overall balance assessment: Mild deficits observed, not formally tested Sitting-balance support: No upper extremity supported, Feet supported, Bilateral upper extremity supported Sitting balance-Leahy Scale: Normal       Standing balance-Leahy Scale: Fair                 High Level Balance Comments: use of SPC with very slowed gait and mild instability            Communication Communication Communication: No apparent difficulties  Cognition Arousal: Alert Behavior During Therapy: WFL for tasks assessed/performed   PT - Cognitive impairments: No apparent impairments                         Following commands: Intact  Cueing Cueing Techniques: Verbal cues  Exercises      General Comments        Pertinent Vitals/Pain Pain Assessment Pain Assessment: Faces Faces Pain Scale: Hurts even more Pain Location: L heel with WB Pain Descriptors / Indicators: Aching, Discomfort, Guarding Pain Intervention(s): Limited activity within patient's tolerance, Monitored during session     PT Goals (current goals can now be found in the care plan section) Acute Rehab PT Goals Patient Stated Goal: to  return home PT Goal Formulation: With patient Time For Goal Achievement: 03/23/24 Progress towards PT goals: Progressing toward goals    Frequency    Min 2X/week       AM-PAC PT 6 Clicks Mobility   Outcome Measure  Help needed turning from your back to your side while in a flat bed without using bedrails?: None Help needed moving from lying on your back to sitting on the side of a flat bed without using bedrails?: None Help needed moving to and from a bed to a chair (including a wheelchair)?: None Help needed standing up from a chair using your arms (e.g., wheelchair or bedside chair)?: None Help needed to walk in hospital room?: A Little Help needed climbing 3-5 steps with a railing? : A Little 6 Click Score: 22    End of Session Equipment Utilized During Treatment: Gait belt Activity Tolerance: Patient tolerated treatment well;Patient limited by fatigue Patient left: in bed;with call bell/phone within reach;with family/visitor present Nurse Communication: Mobility status PT Visit Diagnosis: Unsteadiness on feet (R26.81);Other abnormalities of gait and mobility (R26.89)     Time: 8854-8784 PT Time Calculation (min) (ACUTE ONLY): 30 min  Charges:    $Gait Training: 23-37 mins PT General Charges $$ ACUTE PT VISIT: 1 Visit                    Darryle George, PTA Acute Rehabilitation Services Secure Chat Preferred  [phone]    Darryle George 03/11/2024, 12:37 PM

## 2024-03-11 NOTE — Transfer of Care (Signed)
 Immediate Anesthesia Transfer of Care Note  Patient: Debra Obrien  Procedure(s) Performed: TRANSESOPHAGEAL ECHOCARDIOGRAM  Patient Location: PACU  Anesthesia Type:MAC  Level of Consciousness: awake, alert , and oriented  Airway & Oxygen Therapy: Patient Spontanous Breathing and Patient connected to face mask oxygen  Post-op Assessment: Report given to RN and Post -op Vital signs reviewed and stable  Post vital signs: Reviewed and stable  Last Vitals:  Vitals Value Taken Time  BP 82/47 03/11/24 09:24  Temp    Pulse 106 03/11/24 09:26  Resp 24 03/11/24 09:26  SpO2 97 % 03/11/24 09:26  Vitals shown include unfiled device data.  Last Pain:  Vitals:   03/11/24 0827  TempSrc:   PainSc: 0-No pain      Patients Stated Pain Goal: 0 (03/10/24 2013)  Complications: No notable events documented.

## 2024-03-11 NOTE — CV Procedure (Signed)
    TRANSESOPHAGEAL ECHOCARDIOGRAM   NAME:  Debra Obrien    MRN: [id number] DOB:  10/15/1980    ADMIT DATE: 03/08/2024  INDICATIONS: CVA  PROCEDURE:   Informed consent was obtained prior to the procedure. The risks, benefits and alternatives for the procedure were discussed and the patient comprehended these risks.  Risks include, but are not limited to, cough, sore throat, vomiting, nausea, somnolence, esophageal and stomach trauma or perforation, bleeding, low blood pressure, aspiration, pneumonia, infection, trauma to the teeth and death.    Procedural time out performed. The oropharynx was anesthetized with viscous lidocaine .  Anesthesia was administered by the anaesthesilogy team to achieve and maintain moderate to deep conscious sedation.  The patient's heart rate, blood pressure, and oxygen saturation were monitored continuously during the procedure.  The transesophageal probe was inserted in the esophagus and stomach without difficulty and multiple views were obtained.   The patient tolerated the procedure well.  COMPLICATIONS:    There were no immediate complications.  KEY FINDINGS:  Normal EF, no left atrial appendage clot, agitated saline study negative.  Full report to follow. Further management per primary team.   Vergene Marland, DO [facility]  [facility]  9:26 AM

## 2024-03-11 NOTE — Discharge Summary (Signed)
 Name: Debra Obrien MRN: [id number] DOB: 1980-08-21 43 y.o. PCP: [facility]  Date of Admission: 03/08/2024 10:01 AM Date of Discharge: 03/11/2024 Attending Physician: Dr. MICAEL Riis Winfrey  Discharge Diagnosis: 1. Principal Problem:   Acute cerebral infarction [facility]) Active Problems:   Hypertensive emergency   Cerebrovascular accident (CVA) due to thrombosis of right middle cerebral artery [facility])   Discharge Medications: Allergies as of 03/11/2024   No Known Allergies      Medication List     TAKE these medications    aspirin EC 81 MG tablet Take 1 tablet (81 mg total) by mouth daily. Swallow whole. Start taking on: March 12, 2024   clopidogrel 75 MG tablet Commonly known as: PLAVIX Take 1 tablet (75 mg total) by mouth daily. Start taking on: March 12, 2024   losartan-hydrochlorothiazide  50-12.5 MG tablet Commonly known as: HYZAAR Take 1 tablet by mouth daily.   rosuvastatin 10 MG tablet Commonly known as: CRESTOR Take 1 tablet (10 mg total) by mouth daily. Start taking on: March 12, 2024 What changed:  medication strength how much to take   Vitamin D (Ergocalciferol) 1.25 MG (50000 UNIT) Caps capsule Commonly known as: DRISDOL Take 50,000 Units by mouth every 7 (seven) days.               Durable Medical Equipment  (From admission, onward)           Start     Ordered   03/11/24 1347  For home use only DME Tub bench  Once        03/11/24 1346   03/11/24 1345  For home use only DME Cane  Once        03/11/24 1344   03/11/24 1342  For home use only DME Cane  Once        03/11/24 1346            Disposition and follow-up:   [name] was discharged from [facility] in Good condition.  At the hospital follow up visit please address:  1.  CVA: Subacute right MCA infarct seen on imaging. Please ensure patient has made a follow up with Neurology to be seen outpatient. Please  ensure she has scheduled an appointment with Neurorehab. Discussed with patient the importance of smoking and cocaine cessation, please continue these discussions. Recommend repeat MRI brain in 3 months to ensure resolution.   HLD: LDL at 91. LDL goal is < 70. She was increased to Rosuvastatin 10 mg daily. Please continue to uptitrate patient to Rosuvastatin 20 mg daily to reach high intensity and monitor LDL.   HTN: Patient was allowed permissive hypertension and then resumed on her home BP medication regimen. Please continue to titrate these as able, with consideration of switching patient to Olmesartan, Irbesartan, or Valsartan and not the hydrochlorothiazide . She did have some hypokalemia which improved with supplementation, though would consider alternative options for her BP.   Substance Use: Please continue cessation discussions of cocaine and marijuana. Please continue tobacco smoking cessation counseling.   AKI: patient had a mild AKI that resolved by day of discharge. Please obtain repeat BMP to ensure continued resolution.   2.  Labs / imaging needed at time of follow-up: CBC at PCP f/u, BMP at PCP f/u, lipid panel in 6 weeks, MRI brain in 3 months to ensure resolution    3.  Pending labs/ test needing follow-up: None   Follow-up Appointments:  Follow-up Information  [facility]. Call.   Why: Please call [facility] 2-3 days in advance to schedule transportation. Contact information: [phone]        [facility]. Schedule an appointment as soon as possible for a visit.   Specialty: Rehabilitation Contact information: [address] [place]  [address] [phone]        [facility]. Schedule an appointment as soon as possible for a visit in 1 month(s).   Specialty: Neurology Why: stroke clinic Contact information: [address] [place]   [address] [phone]        [facility] Schedule an appointment as soon as possible for a visit.   Why: Please schedule an appointment with your PCP to be seen in 1-2 weeks following discharge from the hospital. Contact information: [address] [place] [address] [phone]                  Hospital Course by problem list: ALAYZHA AN is a 43 y.o. female with a history of HTN, PID, and substance use (Marijuana, cocaine, cigarettes), who presents with complaints of dizziness and weakness/fatigue since Saturday 11/8 morning, and admitted for subacute MCA infarct and stroke work up now being discharged on hospital day 2 with the following pertinent hospital course:  ##CVA: Subacute posterior right MCA infarct Patient presented with complaints of dizziness and weakness since Saturday 11/8 AM.  Last known normal was Friday 11/7 at 2200 before patient went to bed.  Global headache 5/10 pain began today. CT head obtained today showed subacute posterior right MCA territory infarct with no hemorrhagic transformation or mass effect.  On initial physical exam, no focal deficits noted, but patient with mildly decreased 4+/5 grip strength and complaints of overall weakness, though with 5/5 strength otherwise in all extremities.  She did report a history of hypertension and last took her losartan-hydrochlorothiazide  on Wednesday 11/5, though ran out of her medication. She also notes her last cocaine use was on Wednesday as well. Neurology and stroke team were following patient. Echo with bubble showed EF of 60-65% and bubble study negative. Transcranial doppler w/ bubble negative. Bilateral LE doppler U/S negative for DVT. A1C of 5.8. LDL not at goal 91. She was increased to Rosuvastatin 10 mg daily and recommend uptitrating to 20 mg daily to achieve high intensity. She was initiated on Plavix 75 mg daily for 21 days beginning 11/9 and Aspirin 81 mg daily, then after  21 days ASA only thereafter. TEE obtained was unremarkable. She was deemed ready for discharge following stroke work up.  - Continue Plavix 75 mg daily for 21 days (i11/9) - Continue Aspirin 81 mg daily indefinitely  - Continue Rosuvastatin 10 mg daily  - Recommend uptitration to Rosuvastatin 20 mg daily outpatient as patient is able to tolerate - Goal LDL < 70  - Repeat MRI in 3 months to confirm resolution     #HTN Patient has a history of hypertension and takes home losartan-hydrochlorothiazide  50-12.5 mg daily.  She reports that she last took this medication on Wednesday 11/5, but then ran out.  She reports this morning home SBP of 200s.  Last known normal was Friday 11/7 at 2200, she was allowed permissive hypertension for a minimum of 48 hours and then resumed on her home losartan-hydrochlorothiazide  50-12.5 mg daily.  - Continue home losartan-hydrochlorothiazide  50-12.5 mg daily  - Advised patient to keep twice daily BP log and recommend  uptitration in outpatient setting as indicated    #AKI, resolved  Baseline Cr ~ 0.9. Cr increased to 1.26, BUN 17, and eGFR 54. Cr improved by day of discharge to 1.15, eGFR > 60 by day of discharge.  - BMP    #HLD Patient takes home rosuvastatin 5 mg daily.  Goal LDL < 70. LDL elevated at 91. Neurology increased to 10 mg daily. Would recommend if patient tolerates the 10 mg dosing, to uptitrate to Rosuvastatin 20 mg daily to reach high intensity dosing.  - Continue Rosuvastatin 10 mg daily  - Recommend uptitration to Rosuvastatin 20 mg daily outpatient  - Goal LDL < 70    #Cocaine Use Disorder #Tobacco Use Disorder  #Marijuana Use Have discussed cessation of substances with patient. Continue to recommend cessation counseling outpatient.    #Macrocytic Anemia, lab error on admission  Initially presented with Hgb of 8.7. MCV 101. Vitamin B12, Folate and iron studies wnl. On repeat daily CBCs during admission, Hgb has been wnl in 13s. Suspect  initial lab was an error and that patient does NOT have anemia.     Subjective Patient doing well today. She had some shakiness of her right leg that she noticed, though was able to ambulate with PT for a longer gait distance than previously. She continues to be motivated and is ready for discharge today.   Discharge Exam:   BP (!) 140/88 (BP Location: Right Arm)   Pulse (!) 52   Temp 98.4 F (36.9 C) (Oral)   Resp 16   Ht 5' 5 (1.651 m)   Wt 131.3 kg   SpO2 99%   BMI 48.17 kg/m   Physical Exam:   Constitutional: well-appearing female sitting in hospital bed, in no acute distress HEENT: normocephalic atraumatic, mucous membranes moist Cardiovascular: regular rate and rhythm Pulmonary/Chest: normal work of breathing on room air Abdominal: soft, non-tender, non-distended MSK: normal bulk and tone. Neurological:  Neuro: *MS: A&O x4. Follows multi-step commands.  *Speech: no dysarthria or aphasia, able to name and repeat. *CN:    I: Deferred   II,III: PERRLA, VFF by confrontation, optic discs not visualized 2/2 pupillary constriction   III,IV,VI: EOMI w/o nystagmus, no ptosis   V: Sensation intact from V1 to V3 to LT   VII: Eyelid closure was full.  Smile symmetric.   VIII: Hearing intact to voice   IX,X: Voice normal   XI: SCM/trap 5/5 bilat   XII: Tongue protrudes midline, no atrophy or fasciculations  *Motor:   Normal bulk.  No tremor, rigidity or bradykinesia. No pronator drift.    Strength: Dlt Bic Tri WE WrF FgS Gr HF KnF KnE PlF DoF    Left 5 5 5 5 5 5 5 5 5 5 5 5     Right 5 5 5 5 5 5 5 5 5 5 5 5     *Sensory: Intact to light touch throughout. Symmetric.  *Coordination:  Heel-to-shin intact. *Gait: deferred     Skin: warm and dry Psych: mood calm, behavior normal, thought content normal, judgement normal    Pertinent Labs, Studies, and Procedures:     Latest Ref Rng & Units 03/11/2024    1:30 AM 03/10/2024    1:20 AM 03/09/2024    1:35 AM  CBC  WBC 4.0  - 10.5 K/uL 10.5  9.6  8.9   Hemoglobin 12.0 - 15.0 g/dL 86.2  86.5  86.3   Hematocrit 36.0 - 46.0 % 41.6  40.6  41.3  Platelets 150 - 400 K/uL 369  366  374        Latest Ref Rng & Units 03/11/2024    1:30 AM 03/10/2024    1:20 AM 03/09/2024    1:35 AM  CMP  Glucose 70 - 99 mg/dL 880  879  880   BUN 6 - 20 mg/dL 20  17  12    Creatinine 0.44 - 1.00 mg/dL 8.84  8.73  9.05   Sodium 135 - 145 mmol/L 135  136  135   Potassium 3.5 - 5.1 mmol/L 3.7  3.3  3.4   Chloride 98 - 111 mmol/L 100  100  97   CO2 22 - 32 mmol/L 25  27  26    Calcium 8.9 - 10.3 mg/dL 9.0  8.5  9.1     VAS US  LOWER EXTREMITY VENOUS (DVT) Result Date: 03/10/2024  Lower Venous DVT Study Patient Name:  Debra Obrien  Date of Exam:   03/09/2024 Medical Rec #: [id number]         Accession #:    [id number] Date of Birth: 09-25-1980         Patient Gender: F Patient Age:   23 years Exam Location:  [facility] Procedure:      VAS US  LOWER EXTREMITY VENOUS (DVT) Referring Phys: ARY XU --------------------------------------------------------------------------------  Indications: Stroke.  Limitations: Poor ultrasound/tissue interface. Comparison Study: No previous exams Performing Technologist: Jody Hill RVT, RDMS  Examination Guidelines: A complete evaluation includes B-mode imaging, spectral Doppler, color Doppler, and power Doppler as needed of all accessible portions of each vessel. Bilateral testing is considered an integral part of a complete examination. Limited examinations for reoccurring indications may be performed as noted. The reflux portion of the exam is performed with the patient in reverse Trendelenburg.  +---------+---------------+---------+-----------+----------+--------------+ RIGHT    CompressibilityPhasicitySpontaneityPropertiesThrombus Aging +---------+---------------+---------+-----------+----------+--------------+ CFV      Full           Yes      Yes                                  +---------+---------------+---------+-----------+----------+--------------+ SFJ      Full                                                        +---------+---------------+---------+-----------+----------+--------------+ FV Prox  Full           Yes      Yes                                 +---------+---------------+---------+-----------+----------+--------------+ FV Mid   Full           Yes      Yes                                 +---------+---------------+---------+-----------+----------+--------------+ FV DistalFull           Yes      Yes                                 +---------+---------------+---------+-----------+----------+--------------+  PFV      Full                                                        +---------+---------------+---------+-----------+----------+--------------+ POP      Full           Yes      Yes                                 +---------+---------------+---------+-----------+----------+--------------+ PTV      Full                                                        +---------+---------------+---------+-----------+----------+--------------+ PERO     Full                                                        +---------+---------------+---------+-----------+----------+--------------+   +---------+---------------+---------+-----------+----------+--------------+ LEFT     CompressibilityPhasicitySpontaneityPropertiesThrombus Aging +---------+---------------+---------+-----------+----------+--------------+ CFV      Full           Yes      Yes                                 +---------+---------------+---------+-----------+----------+--------------+ SFJ      Full                                                        +---------+---------------+---------+-----------+----------+--------------+ FV Prox  Full           Yes      Yes                                  +---------+---------------+---------+-----------+----------+--------------+ FV Mid   Full           Yes      Yes                                 +---------+---------------+---------+-----------+----------+--------------+ FV DistalFull           Yes      Yes                                 +---------+---------------+---------+-----------+----------+--------------+ PFV      Full                                                        +---------+---------------+---------+-----------+----------+--------------+  POP      Full           Yes      Yes                                 +---------+---------------+---------+-----------+----------+--------------+ PTV      Full                                                        +---------+---------------+---------+-----------+----------+--------------+ PERO     Full                                                        +---------+---------------+---------+-----------+----------+--------------+     Summary: BILATERAL: - No evidence of deep vein thrombosis seen in the lower extremities, bilaterally. -No evidence of popliteal cyst, bilaterally.   *See table(s) above for measurements and observations. Electronically signed by Lonni Gaskins MD on 03/10/2024 at 7:56:43 AM.    Final    VAS US  TRANSCRANIAL DOPPLER W BUBBLES Result Date: 03/09/2024  Transcranial Doppler with Bubble Patient Name:  Debra Obrien  Date of Exam:   03/09/2024 Medical Rec #: [id number]         Accession #:    [id number] Date of Birth: 06-08-1980         Patient Gender: F Patient Age:   52 years Exam Location:  [facility] Procedure:      VAS US  TRANSCRANIAL DOPPLER W BUBBLES Referring Phys: ARY XU --------------------------------------------------------------------------------  Indications: Stroke. Comparison Study: No previous exams Performing Technologist: Jody Hill RVT, RDMS  Examination Guidelines: A complete evaluation includes B-mode  imaging, spectral Doppler, color Doppler, and power Doppler as needed of all accessible portions of each vessel. Bilateral testing is considered an integral part of a complete examination. Limited examinations for reoccurring indications may be performed as noted.  Summary: No HITS at rest or during Valsalva. Negative transcranial Doppler Bubble study with no evidence of right to left intracardiac communication.  A vascular evaluation was performed. The right middle cerebral artery was studied. An IV was inserted into the patient's left forearm. Verbal informed consent was obtained.  *See table(s) above for TCD measurements and observations.  Diagnosing physician: Ary Cummins MD Electronically signed by Ary Cummins MD on 03/09/2024 at 5:38:02 PM.    Final    EEG adult Result Date: 03/09/2024 Shelton Arlin KIDD, MD     03/09/2024  4:58 PM Patient Name: Debra Obrien MRN: [id number] Epilepsy Attending: Arlin KIDD Shelton Referring Physician/Provider: Cummins Ary, MD Date: 03/09/2024 Duration: 22.32 mins Patient history: 43 year old female with right temporoparietal stroke.  EEG ordered for seizure. Level of alertness: Awake AEDs during EEG study: None Technical aspects: This EEG study was done with scalp electrodes positioned according to the 10-20 International system of electrode placement. Electrical activity was reviewed with band pass filter of 1-70Hz , sensitivity of 7 uV/mm, display speed of 60mm/sec with a 60Hz  notched filter applied as appropriate. EEG data were recorded continuously and digitally stored.  Video monitoring was available and reviewed as appropriate. Description: The posterior dominant rhythm consists of 9-10 Hz  activity of moderate voltage (25-35 uV) seen predominantly in posterior head regions, symmetric and reactive to eye opening and eye closing. Physiologic photic driving was not seen during photic stimulation.  Hyperventilation was not performed.   IMPRESSION: This study is within normal  limits. No seizures or epileptiform discharges were seen throughout the recording. A normal interictal EEG does not exclude the diagnosis of epilepsy. Arlin MALVA Krebs   MR BRAIN W CONTRAST Result Date: 03/09/2024 EXAM: MRI BRAIN WITH CONTRAST 03/09/2024 03:56:23 PM TECHNIQUE: Multiplanar multisequence MRI of the head/brain was performed with the administration of 10 mL gadobutrol (GADAVIST) 1 MMOL/ML injection. COMPARISON: CTA head and neck and MRI head 03/08/2024. CLINICAL HISTORY: Stroke suspected (Ped 0-17y). FINDINGS: BRAIN AND VENTRICLES: There is mild cortical enhancement along the posterior right temporal lobe and inferior right parietal lobe in the region of signal abnormality noted on the recent MRI findings. This likely reflects enhancement in the setting of subacute infarct. Reiterate recommendation for follow-up MRI in 3 months to ensure resolution of these findings. There is no nodular or masslike enhancement appreciated on the current study. No acute infarct. No acute intracranial hemorrhage. No mass effect or midline shift. No hydrocephalus. The sella is unremarkable. Normal flow voids. ORBITS: No acute abnormality. SINUSES: No acute abnormality. BONES AND SOFT TISSUES: Normal bone marrow signal and enhancement. No acute soft tissue abnormality. IMPRESSION: 1. Mild cortical enhancement along the posterior right temporal and inferior right parietal lobes, likely reflecting subacute infarct-related enhancement. 2. No nodular or masslike enhancement. 3. Follow-up MRI in 3 months is recommended to ensure resolution. Electronically signed by: Donnice Mania MD 03/09/2024 04:42 PM EST RP Workstation: [id number]   ECHOCARDIOGRAM COMPLETE Result Date: 03/09/2024    ECHOCARDIOGRAM REPORT   Patient Name:   KEONDRIA SIEVER Haste Date of Exam: 03/09/2024 Medical Rec #:  [id number]        Height:       69.0 in Accession #:    [id number]       Weight:       270.5 lb Date of Birth:  14-Dec-1980        BSA:           2.349 m Patient Age:    43 years         BP:           193/105 mmHg Patient Gender: F                HR:           80 bpm. Exam Location:  Inpatient Procedure: 2D Echo, Cardiac Doppler, Color Doppler and Saline Contrast Bubble            Study (Both Spectral and Color Flow Doppler were utilized during            procedure). Indications:     Stroke  History:         Patient has no prior history of Echocardiogram examinations.  Sonographer:     Carmelita Glendale NESTLE, FE, PE Referring Phys:  [id number] ELSIE KATHEE SAVANNAH Diagnosing Phys: Wilbert Bihari MD IMPRESSIONS  1. Left ventricular ejection fraction, by estimation, is 60 to 65%. The left ventricle has normal function. The left ventricle has no regional wall motion abnormalities. The left ventricular internal cavity size was mildly dilated. There is mild concentric left ventricular hypertrophy. Left ventricular diastolic parameters are consistent with Grade I diastolic dysfunction (impaired relaxation).  2. Right ventricular systolic function is normal. The right ventricular size is  normal. Tricuspid regurgitation signal is inadequate for assessing PA pressure.  3. The mitral valve is normal in structure. Trivial mitral valve regurgitation. No evidence of mitral stenosis.  4. The aortic valve was not well visualized. Aortic valve regurgitation is not visualized. Aortic valve sclerosis is present, with no evidence of aortic valve stenosis.  5. The inferior vena cava is normal in size with greater than 50% respiratory variability, suggesting right atrial pressure of 3 mmHg.  6. Agitated saline contrast bubble study was negative, with no evidence of any interatrial shunt. Conclusion(s)/Recommendation(s): No intracardiac source of embolism detected on this transthoracic study. Consider a transesophageal echocardiogram to exclude cardiac source of embolism if clinically indicated. FINDINGS  Left Ventricle: Left ventricular ejection fraction, by estimation, is 60 to 65%. The  left ventricle has normal function. The left ventricle has no regional wall motion abnormalities. The left ventricular internal cavity size was mildly dilated. There is  mild concentric left ventricular hypertrophy. Left ventricular diastolic parameters are consistent with Grade I diastolic dysfunction (impaired relaxation). Normal left ventricular filling pressure. Right Ventricle: The right ventricular size is normal. No increase in right ventricular wall thickness. Right ventricular systolic function is normal. Tricuspid regurgitation signal is inadequate for assessing PA pressure. The tricuspid regurgitant velocity is 1.71 m/s, and with an assumed right atrial pressure of 3 mmHg, the estimated right ventricular systolic pressure is 14.7 mmHg. Left Atrium: Left atrial size was normal in size. Right Atrium: Right atrial size was normal in size. Pericardium: There is no evidence of pericardial effusion. Mitral Valve: The mitral valve is normal in structure. Trivial mitral valve regurgitation. No evidence of mitral valve stenosis. Tricuspid Valve: The tricuspid valve is normal in structure. Tricuspid valve regurgitation is trivial. No evidence of tricuspid stenosis. Aortic Valve: The aortic valve was not well visualized. Aortic valve regurgitation is not visualized. Aortic valve sclerosis is present, with no evidence of aortic valve stenosis. Pulmonic Valve: The pulmonic valve was normal in structure. Pulmonic valve regurgitation is trivial. No evidence of pulmonic stenosis. Aorta: The aortic root is normal in size and structure. Venous: The inferior vena cava is normal in size with greater than 50% respiratory variability, suggesting right atrial pressure of 3 mmHg. IAS/Shunts: No atrial level shunt detected by color flow Doppler. Agitated saline contrast was given intravenously to evaluate for intracardiac shunting. Agitated saline contrast bubble study was negative, with no evidence of any interatrial shunt.   LEFT VENTRICLE PLAX 2D LVIDd:         5.40 cm   Diastology LVIDs:         4.00 cm   LV e' medial:    5.44 cm/s LV PW:         1.15 cm   LV E/e' medial:  12.5 LV IVS:        1.10 cm   LV e' lateral:   6.53 cm/s LVOT diam:     2.61 cm   LV E/e' lateral: 10.4 LV SV:         85 LV SV Index:   36 LVOT Area:     5.35 cm  RIGHT VENTRICLE RV Basal diam:  3.09 cm RV Mid diam:    2.32 cm RV S prime:     12.80 cm/s TAPSE (M-mode): 2.1 cm LEFT ATRIUM             Index        RIGHT ATRIUM           Index  LA diam:        4.28 cm 1.82 cm/m   RA Area:     17.90 cm LA Vol (A2C):   56.4 ml 24.01 ml/m  RA Volume:   50.80 ml  21.63 ml/m LA Vol (A4C):   85.6 ml 36.44 ml/m LA Biplane Vol: 73.3 ml 31.21 ml/m  AORTIC VALVE LVOT Vmax:   85.90 cm/s LVOT Vmean:  56.100 cm/s LVOT VTI:    0.158 m  AORTA Ao Root diam: 3.10 cm Ao Asc diam:  2.94 cm MITRAL VALVE               TRICUSPID VALVE MV Area (PHT): 4.06 cm    TR Peak grad:   11.7 mmHg MV Decel Time: 187 msec    TR Vmax:        171.00 cm/s MV E velocity: 68.10 cm/s MV A velocity: 86.50 cm/s  SHUNTS MV E/A ratio:  0.79        Systemic VTI:  0.16 m                            Systemic Diam: 2.61 cm Wilbert Bihari MD Electronically signed by Wilbert Bihari MD Signature Date/Time: 03/09/2024/10:36:51 AM    Final (Updated)    CT ANGIO HEAD NECK W WO CM Result Date: 03/08/2024 EXAM: CTA HEAD AND NECK WITHOUT AND WITH 03/08/2024 05:23:21 PM TECHNIQUE: CTA of the head and neck was performed without and with the administration of 75 mL of intravenous iohexol  (OMNIPAQUE ) 350 MG/ML injection. Multiplanar 2D and/or 3D reformatted images are provided for review. Automated exposure control, iterative reconstruction, and/or weight based adjustment of the mA/kV was utilized to reduce the radiation dose to as low as reasonably achievable. Stenosis of the internal carotid arteries measured using NASCET criteria. COMPARISON: Same day CT head and MRI head. CLINICAL HISTORY: Neuro deficit, acute, stroke  suspected. FINDINGS: CTA NECK: AORTIC ARCH AND ARCH VESSELS: Common origin of the brachiocephalic and left common carotid arteries. There is mild atherosclerosis at the common origin without stenosis. The subclavian arteries are patent bilaterally. No dissection or arterial injury. CERVICAL CAROTID ARTERIES: The right carotid artery is patent from the origin to the skull base; there is no hemodynamically significant stenosis. The left carotid artery is patent from the origin to the skull base; there is no hemodynamically significant stenosis. No dissection or arterial injury. CERVICAL VERTEBRAL ARTERIES: The vertebral arteries are patent from the origins to the vertebrobasilar confluence. Mild tortuosity of the bilateral V2 segments. No dissection, arterial injury, or significant stenosis. LUNGS AND MEDIASTINUM: Unremarkable. SOFT TISSUES: There are enlarged bilateral level 1b cervical nodes measuring up to 1.1 cm in short axis. Recommend clinical follow up to resolution. BONES: No acute abnormality. CTA HEAD: ANTERIOR CIRCULATION: The intracranial internal carotid arteries are patent bilaterally. There is atherosclerosis involving the bilateral cavernous ICAs with mild stenosis and atherosclerotic plaque bilaterally. The middle cerebral arteries are patent bilaterally. The anterior cerebral arteries are patent bilaterally. No aneurysm. POSTERIOR CIRCULATION: The vertebral arteries are patent from the origins to the vertebrobasilar confluence. No significant stenosis of the posterior cerebral arteries. No significant stenosis of the basilar artery. No aneurysm. OTHER: No dural venous sinus thrombosis on this non-dedicated study. IMPRESSION: 1. No large vessel occlusion, hemodynamically significant stenosis, or aneurysm in the head or neck. 2. 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 3. Mild atherosclerosis at the common origin of the brachiocephalic and  left  common carotid arteries without stenosis. 4. Enlarged bilateral level 1b cervical lymph nodes measuring up to 1.1 cm in short axis; recommend clinical follow-up for resolution. Electronically signed by: Donnice Mania MD 03/08/2024 06:11 PM EST RP Workstation: [id number]   MR BRAIN WO CONTRAST Result Date: 03/08/2024 EXAM: MRI BRAIN WITHOUT CONTRAST 03/08/2024 05:17:43 PM TECHNIQUE: Multiplanar multisequence MRI of the head/brain was performed without the administration of intravenous contrast. COMPARISON: Same day CT head. CLINICAL HISTORY: Headache, neuro deficit. FINDINGS: BRAIN AND VENTRICLES: No acute infarct. No intracranial hemorrhage. No mass. No midline shift. No hydrocephalus. There is T2/FLAIR hyperintensity involving the posterior right temporal lobe extending into the inferior right parietal lobe with mild gyral expansion and suggestion of edema within this region. There is prominent FLAIR hyperintensity of the cortex of the right temporoparietal lobes within this region associated diffusion signal abnormality likely reflecting T2 shine through. There is no restricted diffusion to suggest acute infarct. There are areas of intrinsic T1 hyperintensity along the cortex within the right temporoparietal lobes with possible subtle susceptibility which may reflect areas of cortical laminar necrosis. The sella is unremarkable. Normal flow voids. ORBITS: No acute abnormality. SINUSES AND MASTOIDS: Mucosal thickening in the ethmoid sinuses. Trace fluid in the mastoid air cells. BONES AND SOFT TISSUES: Normal marrow signal. No acute soft tissue abnormality. IMPRESSION: 1. Signal abnormality and mild edema in the right temporoparietal lobes, likely reflecting subacute infarct. Recommend follow up MRI in 3 months to document resolution. Electronically signed by: Donnice Mania MD 03/08/2024 05:59 PM EST RP Workstation: [id number]   CT Head Wo Contrast Addendum Date: 03/08/2024 **ADDENDUM #1 **ADDENDUM: Study  discussed by telephone with Dr. Laurice at 11:21 hours on March 08, 2024. ---------------------------------------------------- Electronically signed by: Helayne Hurst MD 03/08/2024 11:26 AM EST RP Workstation: [id number]   Result Date: 03/08/2024 **ORIGINAL REPORT ** EXAM: CT HEAD WITHOUT CONTRAST 03/08/2024 10:52:36 AM TECHNIQUE: CT of the head was performed without the administration of intravenous contrast. Automated exposure control, iterative reconstruction, and/or weight based adjustment of the mA/kV was utilized to reduce the radiation dose to as low as reasonably achievable. COMPARISON: Head CT 05/07/2020. CLINICAL HISTORY: 43 year old female with headache and neuro deficit. FINDINGS: BRAIN AND VENTRICLES: No acute hemorrhage. Confluent new hypodensity tracking from the posterior right temporal lobe into the lateral right parietal lobe (series 2 images 13 through 18). No regional mass effect. But On coronal image 31 this does NOT appear to be chronic encephalomalacia. No hemorrhagic transformation. Background brain volume remains normal. Gray white differentiation is stable and within normal limits. No ventriculomegaly. Normal basilar cisterns. No extra-axial collection. No mass effect or midline shift. No suspicious intracranial vascular hyperdensity. Calcified atherosclerosis at the skull base. ORBITS: No acute abnormality. SINUSES: Visible paranasal sinuses, middle ears and mastoids remain well aerated. SOFT TISSUES AND SKULL: No acute soft tissue abnormality. No skull fracture. IMPRESSION: 1. 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 Electronically signed by: Helayne Hurst MD 03/08/2024 11:03 AM EST RP Workstation: [id number]   DG Chest Portable 1 View Result Date: 03/08/2024 EXAM: 1 VIEW(S) XRAY OF THE CHEST 03/08/2024 10:45:00 AM COMPARISON: Chest radiographs dated 04/24/2012. CLINICAL HISTORY: chest pain FINDINGS: LUNGS AND PLEURA: When  allowing for portable technique: No focal pulmonary opacity. No pulmonary edema. No pleural effusion. No pneumothorax. HEART AND MEDIASTINUM: Borderline to mild cardiomegaly appears chronic and stable. No acute abnormality of the mediastinal silhouette. BONES AND SOFT TISSUES: No acute osseous abnormality. IMPRESSION: 1. No acute cardiopulmonary  process. Electronically signed by: Helayne Hurst MD 03/08/2024 11:04 AM EST RP Workstation: [id number]     Discharge Instructions: Discharge Instructions     Ambulatory referral to Neurology   Complete by: As directed    Follow up with stroke clinic NP at [facility] in about 4-6 weeks. Thanks.   Ambulatory referral to Speech Therapy   Complete by: As directed    Diet - low sodium heart healthy   Complete by: As directed    Increase activity slowly   Complete by: As directed          Discharge Instructions      To Ms. Sameena Artus Malmberg or their caretakers,  They were admitted to [facility] on 03/08/2024 for evaluation and treatment of: dizziness and weakness     The evaluation suggested a new subacute stroke in the right side of your brain.   They were discharged from the hospital on 03/11/24. I recommend the following after leaving the hospital:   Medications Adjusted During Hospital Admission:  1) TAKE Aspirin 81 mg daily (this medication will be taken for the rest of your life)  2) TAKE Plavix (Clopidegrel) 75 mg daily for 17 more days (a total of 21 days from the day we started you on it in the hospital). After 21 days, you will discontinue Plavix.   3) TAKE Rosuvastatin (Crestor) 10 mg daily   4) TAKE Losartan-hydrochlorothiazide  (Hyzaar) 50-12.5 mg daily   Begin all of the above medications on 03/12/2024, as you received all of the medications on the day of your discharge.   Please keep a daily log of your blood pressure readings. Take your blood pressure twice daily and keep a detailed written log to bring to your PCP  appointment.    If you do have a mild-moderate headache, take Tylenol  according to the instructions on the label. Avoid NSAIDs (ibuprofen , naproxen ) as you now taking Aspirin daily.  You may obtain Aspirin 81 mg over the counter.    Please make sure you make follow up appointments as follows:  Follow-up Information     [facility]. Call.   Why: Please call [facility] 2-3 days in advance to schedule transportation. Contact information: [phone]        [facility] Schedule an appointment as soon as possible for a visit.   Specialty: Rehabilitation Contact information: [address] [place]  [address] [phone]        [facility]. Schedule an appointment as soon as possible for a visit in 1 month(s).   Specialty: Neurology Why: stroke clinic Contact information: [address] [place]  [phone]        [facility] Schedule an appointment as soon as possible for a visit.   Why: Please schedule an appointment with your PCP to be seen in 1-2 weeks following discharge from the hospital. Contact information: [address] [place] [address] [phone]                   For questions about your care plan, until you are able to see your primary doctor: Call [phone]. Dial 0 for the operator. Ask for the internal medicine resident on call.  Thank you for allowing us  to be part of your care.   Doyal Miyamoto, MD 03/11/2024, 1:19 PM         Signed: Doyal Miyamoto, MD 03/11/2024, 1:51 PM

## 2024-03-11 NOTE — Anesthesia Postprocedure Evaluation (Signed)
 Anesthesia Post Note  Patient: Debra Obrien  Procedure(s) Performed: TRANSESOPHAGEAL ECHOCARDIOGRAM     Patient location during evaluation: PACU Anesthesia Type: MAC Level of consciousness: awake and alert Pain management: pain level controlled Vital Signs Assessment: post-procedure vital signs reviewed and stable Respiratory status: spontaneous breathing, nonlabored ventilation and respiratory function stable Cardiovascular status: blood pressure returned to baseline Postop Assessment: no apparent nausea or vomiting Anesthetic complications: no   No notable events documented.  Last Vitals:  Vitals:   03/11/24 0935 03/11/24 0940  BP: (!) 100/55 112/71  Pulse: 100 97  Resp: 13 20  Temp:    SpO2: 98% 97%    Last Pain:  Vitals:   03/11/24 0827  TempSrc:   PainSc: 0-No pain                 Vertell Row

## 2024-03-11 NOTE — TOC Transition Note (Signed)
 Transition of Care [facility]) - Discharge Note   Patient Details  Name: Debra Obrien MRN: [id number] Date of Birth: 1981/02/21  [facility]) CM/SW Contact:  Landry DELENA Senters, RN Phone Number: 03/11/2024, 1:50 PM   Clinical Narrative:     Patient discharging to home today, with friend providing transport home. Cane and tub bench to be delivered to room before d/c by [employer]. CM sent referral to [facility] neuro rehab for therapy. Info on AVS. No further needs identified by CM.   Final next level of care: OP Rehab Barriers to Discharge: No Barriers Identified   Patient Goals and CMS Choice     Choice offered to / list presented to : Patient      Discharge Placement                       Discharge Plan and Services Additional resources added to the After Visit Summary for                  DME Arranged: [place], Tub bench DME Agency: [employer] Date DME Agency Contacted: 03/11/24 Time DME Agency Contacted: 1350 Representative spoke with at DME Agency: [place]            Social Drivers of Health (SDOH) Interventions SDOH Screenings   Food Insecurity: No Food Insecurity (03/08/2024)  Housing: Low Risk  (03/08/2024)  Transportation Needs: No Transportation Needs (03/08/2024)  Utilities: Not At Risk (03/08/2024)  Depression (PHQ2-9): Low Risk  (02/09/2019)  Tobacco Use: High Risk (03/11/2024)     Readmission Risk Interventions     No data to display

## 2024-04-07 NOTE — Progress Notes (Unsigned)
 PATIENT: Debra Obrien DOB: 30-Jul-1980  REASON FOR VISIT: follow up HISTORY FROM: patient PRIMARY NEUROLOGIST: Dr. Rosemarie  No chief complaint on file.    HISTORY OF PRESENT ILLNESS: Today   Debra Obrien is a 43 y.o. female who has been followed in this office for ***. Returns today for follow-up.   Imaging:    FU: Labs: Carotid dopplers? ECHO: Discharge note Work? OSA?   HISTORY (copied from Hospital)  REVIEW OF SYSTEMS: Out of a complete 14 system review of symptoms, the patient complains only of the following symptoms, and all other reviewed systems are negative.  ALLERGIES: No Known Allergies  HOME MEDICATIONS: Outpatient Medications Prior to Visit  Medication Sig Dispense Refill   aspirin  EC 81 MG tablet Take 1 tablet (81 mg total) by mouth daily. Swallow whole. 30 tablet 0   clopidogrel  (PLAVIX ) 75 MG tablet Take 1 tablet (75 mg total) by mouth daily. 17 tablet 0   losartan -hydrochlorothiazide  (HYZAAR) 50-12.5 MG tablet Take 1 tablet by mouth daily.     rosuvastatin  (CRESTOR ) 10 MG tablet Take 1 tablet (10 mg total) by mouth daily. 30 tablet 0   Vitamin D, Ergocalciferol, (DRISDOL) 1.25 MG (50000 UNIT) CAPS capsule Take 50,000 Units by mouth every 7 (seven) days.     No facility-administered medications prior to visit.    PAST MEDICAL HISTORY: Past Medical History:  Diagnosis Date   Hypertension     PAST SURGICAL HISTORY: Past Surgical History:  Procedure Laterality Date   TRANSESOPHAGEAL ECHOCARDIOGRAM (CATH LAB) N/A 03/11/2024   Procedure: TRANSESOPHAGEAL ECHOCARDIOGRAM;  Surgeon: Sheena Pugh, DO;  Location: [facility] INVASIVE CV LAB;  Service: Cardiovascular;  Laterality: N/A;    FAMILY HISTORY: Family History  Problem Relation Age of Onset   Diabetes Father     SOCIAL HISTORY: Social History   Socioeconomic History   Marital status: Single    Spouse name: Not on file   Number of children: Not on file   Years of education: Not on  file   Highest education level: Not on file  Occupational History   Not on file  Tobacco Use   Smoking status: Every Day    Current packs/day: 1.00    Types: Cigarettes   Smokeless tobacco: Never  Vaping Use   Vaping status: Never Used  Substance and Sexual Activity   Alcohol use: Yes    Comment: 1-2 times a month.    Drug use: Yes    Types: Marijuana, Cocaine    Comment: Marijuana: Daily. Cocaine: Once every 2 weeks.    Sexual activity: Not Currently  Other Topics Concern   Not on file  Social History Narrative   Not on file   Social Drivers of Health   Financial Resource Strain: Not on file  Food Insecurity: No Food Insecurity (03/08/2024)   Hunger Vital Sign    Worried About Running Out of Food in the Last Year: Never true    Ran Out of Food in the Last Year: Never true  Transportation Needs: No Transportation Needs (03/08/2024)   PRAPARE - Administrator, Civil Service (Medical): No    Lack of Transportation (Non-Medical): No  Physical Activity: Not on file  Stress: Not on file  Social Connections: Not on file  Intimate Partner Violence: Not At Risk (03/08/2024)   Humiliation, Afraid, Rape, and Kick questionnaire    Fear of Current or Ex-Partner: No    Emotionally Abused: No    Physically Abused: No  Sexually Abused: No      PHYSICAL EXAM  There were no vitals filed for this visit. There is no height or weight on file to calculate BMI.  Generalized: Well developed, in no acute distress   Neurological examination  Mentation: Alert oriented to time, place, history taking. Follows all commands speech and language fluent Cranial nerve II-XII: Pupils were equal round reactive to light. Extraocular movements were full, visual field were full on confrontational test. Facial sensation and strength were normal. Uvula tongue midline. Head turning and shoulder shrug  were normal and symmetric. Motor: The motor testing reveals 5 over 5 strength of all 4  extremities. Good symmetric motor tone is noted throughout.  Sensory: Sensory testing is intact to soft touch on all 4 extremities. No evidence of extinction is noted.  Coordination: Cerebellar testing reveals good finger-nose-finger and heel-to-shin bilaterally.  Gait and station: Gait is normal. Tandem gait is normal. Romberg is negative. No drift is seen.  Reflexes: Deep tendon reflexes are symmetric and normal bilaterally.   DIAGNOSTIC DATA (LABS, IMAGING, TESTING) - I reviewed patient records, labs, notes, testing and imaging myself where available.  Lab Results  Component Value Date   WBC 10.5 03/11/2024   HGB 13.7 03/11/2024   HCT 41.6 03/11/2024   MCV 92.2 03/11/2024   PLT 369 03/11/2024      Component Value Date/Time   NA 135 03/11/2024 0130   NA 140 04/24/2012 1510   K 3.7 03/11/2024 0130   K 3.1 (L) 04/24/2012 1510   CL 100 03/11/2024 0130   CL 106 04/24/2012 1510   CO2 25 03/11/2024 0130   CO2 27 04/24/2012 1510   GLUCOSE 119 (H) 03/11/2024 0130   GLUCOSE 87 04/24/2012 1510   BUN 20 03/11/2024 0130   BUN 9 04/24/2012 1510   CREATININE 1.15 (H) 03/11/2024 0130   CREATININE 0.82 04/24/2012 1510   CALCIUM  9.0 03/11/2024 0130   CALCIUM  8.6 04/24/2012 1510   PROT 7.4 05/07/2020 1202   PROT 7.7 04/24/2012 1510   ALBUMIN 3.6 05/07/2020 1202   ALBUMIN 3.6 04/24/2012 1510   AST 17 05/07/2020 1202   AST 20 04/24/2012 1510   ALT 14 05/07/2020 1202   ALT 21 04/24/2012 1510   ALKPHOS 67 05/07/2020 1202   ALKPHOS 73 04/24/2012 1510   BILITOT 0.7 05/07/2020 1202   BILITOT 0.1 (L) 04/24/2012 1510   GFRNONAA >60 03/11/2024 0130   GFRNONAA >60 04/24/2012 1510   GFRAA >60 05/29/2019 1129   GFRAA >60 04/24/2012 1510   Lab Results  Component Value Date   CHOL 157 03/08/2024   HDL 50 03/08/2024   LDLCALC 91 03/08/2024   TRIG 79 03/08/2024   CHOLHDL 3.1 03/08/2024   Lab Results  Component Value Date   HGBA1C 5.8 (H) 03/09/2024   Lab Results  Component Value Date    VITAMINB12 364 03/09/2024   No results found for: TSH    ASSESSMENT AND PLAN 43 y.o. year old female  has a past medical history of Hypertension. here with ***     Continue {anticoagulants:31417}  and ***  for secondary stroke prevention.   Discussed secondary stroke prevention measures and importance of close PCP follow up for aggressive stroke risk factor management. I have gone over the pathophysiology of stroke, warning signs and symptoms, risk factors and their management in some detail with instructions to go to the closest emergency room for symptoms of concern. HTN: BP goal <130/90.  Stable on *** per PCP HLD:  LDL goal <70. Recent LDL ***.  DMII: A1c goal<7.0. Recent A1c ***.  Encouraged patient to monitor diet and encouraged exercise FU with our office ***  No orders of the defined types were placed in this encounter.  No orders of the defined types were placed in this encounter.     Duwaine Russell, MSN, NP-C 04/07/2024, 2:57 PM [facility] [address] [place], [place] [address] [phone]

## 2024-04-08 ENCOUNTER — Ambulatory Visit

## 2024-04-08 ENCOUNTER — Ambulatory Visit: Admitting: Adult Health

## 2024-04-08 ENCOUNTER — Encounter: Payer: Self-pay | Admitting: Adult Health

## 2024-04-08 ENCOUNTER — Other Ambulatory Visit: Payer: Self-pay

## 2024-04-08 VITALS — BP 166/93 | HR 81 | Ht 69.0 in | Wt 299.4 lb

## 2024-04-08 DIAGNOSIS — R41841 Cognitive communication deficit: Secondary | ICD-10-CM

## 2024-04-08 DIAGNOSIS — R4701 Aphasia: Secondary | ICD-10-CM | POA: Diagnosis not present

## 2024-04-08 DIAGNOSIS — I639 Cerebral infarction, unspecified: Secondary | ICD-10-CM | POA: Diagnosis not present

## 2024-04-08 DIAGNOSIS — I63311 Cerebral infarction due to thrombosis of right middle cerebral artery: Secondary | ICD-10-CM | POA: Diagnosis not present

## 2024-04-08 DIAGNOSIS — R590 Localized enlarged lymph nodes: Secondary | ICD-10-CM

## 2024-04-08 NOTE — Patient Instructions (Addendum)
 Your Plan:  Your Plan:  Continue ASA stop plavix   Blood pressure goal <130/90 Cholesterol LDL goal <70 Diabetes goal A1c <7 Monitor diet and try to exercise  Repeat CT scan of head and neck  Thank you for coming to see us  at [facility]. I hope we have been able to provide you high quality care today.  You may receive a patient satisfaction survey over the next few weeks. We would appreciate your feedback and comments so that we may continue to improve ourselves and the health of our patients.      Thank you for coming to see us  at [facility]. I hope we have been able to provide you high quality care today.  You may receive a patient satisfaction survey over the next few weeks. We would appreciate your feedback and comments so that we may continue to improve ourselves and the health of our patients.

## 2024-04-08 NOTE — Therapy (Signed)
 OUTPATIENT SPEECH LANGUAGE PATHOLOGY EVALUATION   Patient Name: Debra Obrien MRN: [id number] DOB:06/28/1980, 43 y.o., female Today's Date: 04/08/2024  PCP: [facility] REFERRING PROVIDER: Francesco Elsie NOVAK, MD  END OF SESSION:  End of Session - 04/08/24 1301     Visit Number 1    Number of Visits 17    Date for Recertification  06/03/24    SLP Start Time 1102    SLP Stop Time  1149    SLP Time Calculation (min) 47 min    Activity Tolerance Patient tolerated treatment well          Past Medical History:  Diagnosis Date   Hypertension    Past Surgical History:  Procedure Laterality Date   TRANSESOPHAGEAL ECHOCARDIOGRAM (CATH LAB) N/A 03/11/2024   Procedure: TRANSESOPHAGEAL ECHOCARDIOGRAM;  Surgeon: Sheena Pugh, DO;  Location: [facility];  Service: Cardiovascular;  Laterality: N/A;   Patient Active Problem List   Diagnosis Date Noted   Cerebrovascular accident (CVA) due to thrombosis of right middle cerebral artery (HCC) 03/09/2024   Acute cerebral infarction (HCC) 03/08/2024   History of PID 02/26/2019   Hypertensive emergency 02/26/2019   PID (acute pelvic inflammatory disease) 01/19/2019    ONSET DATE: 03/10/2024 (Referral created)   REFERRING DIAG:  I63.311 (ICD-10-CM) - Cerebrovascular accident (CVA) due to thrombosis of right middle cerebral artery (HCC)    THERAPY DIAG:  Cognitive communication deficit  Rationale for Evaluation and Treatment: Rehabilitation  SUBJECTIVE:   SUBJECTIVE STATEMENT: I have to slow down. Pt accompanied by: self and family member  PERTINENT HISTORY: Pt is a 43 y.o. F who was referred for ST s/p CVA. Pt was hospitalized from 03/08/24 to 03/11/24 for CVA. Pt notes that she is having difficulties with word finding, memory difficulties, and energy management. Pt states that her word-finding is worse in the evenings. Pt gets easily fatigued during the day and has reduced activity tolerance  for ADLs. Pt notes that she feels good about household responsibilities. PMHx includes: HTN, HLD, PID, substance use (Marijuana, cocaine, cigarettes).  PAIN:  Are you having pain? No  FALLS: Has patient fallen in last 6 months?  No  LIVING ENVIRONMENT: Lives with: lives with their family and lives with their son Lives in: House/apartment  PLOF:  Level of assistance: Independent with ADLs Employment: Armed forces training and education officer, Student  PATIENT GOALS: To get back to my normal self.   OBJECTIVE:  Note: Objective measures were completed at Evaluation unless otherwise noted.  DIAGNOSTIC FINDINGS: EXAM: MRI BRAIN WITH CONTRAST 03/09/2024 03:56:23 PM   TECHNIQUE: Multiplanar multisequence MRI of the head/brain was performed with the administration of 10 mL gadobutrol  (GADAVIST ) 1 MMOL/ML injection.   COMPARISON: CTA head and neck and MRI head 03/08/2024.   CLINICAL HISTORY: Stroke suspected (Ped 0-17y).   FINDINGS:   BRAIN AND VENTRICLES: There is mild cortical enhancement along the posterior right temporal lobe and inferior right parietal lobe in the region of signal abnormality noted on the recent MRI findings. This likely reflects enhancement in the setting of subacute infarct. Reiterate recommendation for follow-up MRI in 3 months to ensure resolution of these findings. There is no nodular or masslike enhancement appreciated on the current study. No acute infarct. No acute intracranial hemorrhage. No mass effect or midline shift. No hydrocephalus. The sella is unremarkable. Normal flow voids.   ORBITS: No acute abnormality.   SINUSES: No acute abnormality.   BONES AND SOFT TISSUES: Normal bone marrow signal and enhancement. No  acute soft tissue abnormality.   IMPRESSION: 1. Mild cortical enhancement along the posterior right temporal and inferior right parietal lobes, likely reflecting subacute infarct-related enhancement. 2. No nodular or masslike enhancement. 3.  Follow-up MRI in 3 months is recommended to ensure resolution.  COGNITION: Overall cognitive status: Impaired Areas of impairment:  Memory: Impaired: Short term Executive function: Impaired: Problem solving, Organization, Planning, and Slow processing Functional deficits: Remembering details from conversation.  AUDITORY COMPREHENSION: Overall auditory comprehension: Appears intact YES/NO questions: Appears intact Following directions: Appears intact Conversation: Complex Interfering components: processing speed Effective technique: extra processing time  READING COMPREHENSION: Intact   EXPRESSION: verbal  VERBAL EXPRESSION: Level of generative/spontaneous verbalization: conversation Automatic speech: name: intact, social response: intact, and counting: intact  Repetition: Appears intact Naming: Confrontation: 76-100% and Divergent: 76-100% Pragmatics: Appears intact Comments: N/A Interfering components: N/A Effective technique: Will be explored in upcoming sessions. Non-verbal means of communication: N/A  WRITTEN EXPRESSION: Deferred to upcoming session. Dominant hand: N/A Written expression: N/A  MOTOR SPEECH: Overall motor speech: Appears intact   STANDARDIZED ASSESSMENTS: QAB: No aphasia and SLUMS: 22/30 (Moderate)  PATIENT REPORTED OUTCOME MEASURES (PROM): PROM was sent home with pt for completion by next session.                                                                                                                            TREATMENT DATE:   04/08/24: EVALUATION COMPLETED. No tx was completed on this date. Plan is to complete PROMs in upcoming session and to assess writing for academic needs.    PATIENT EDUCATION: Education details: Assessment results. Person educated: Patient Education method: Explanation Education comprehension: verbalized understanding   GOALS: Goals reviewed with patient? Yes  SHORT TERM GOALS: Target date:  05/06/2024  Pt will complete PROM related to communication effectiveness and mental fatigue.  Baseline: Pt took home PROM to complete.  Goal status: INITIAL  2.  Pt will perform Semantic Feature Analysis (SFA) with 90% consistency to optimize descriptions for word-finding given occasional to rare min A. Baseline: N/A Goal status: INITIAL  3.  Pt will utilize spaced-retrieval to learn 5+ new facts about relevant topics to the pt given occasional min A.  Baseline: Impaired short-term memory. Goal status: INITIAL  4.  Pt will implement at least 1 external memory strategies at home for daily tasks.  Baseline: Pt uses no memory strategies.  Goal status: INITIAL  5.  Pt will read a complex text using PQRST with 90% consistency to maximize thought organization and comprehension given occasional min A. Baseline: Pt does not use PQRST as a reading strategy.  Goal status: INITIAL  6.  Pt will integrate energy management strategies for optimizing cognitive energy for ADLs in 80% of opportunities given occasional min A. Baseline: Pt does not use energy management strategies. Goal status: INITIAL  LONG TERM GOALS: Target date: 06/03/2024  The patient will participate in complex conversation, using circumlocution and other word-finding strategies for minimizing  communication breakdown given rare min A.  Baseline: Pt noted word-finding difficulties, especially when tired.  Goal status: INITIAL  2.  Pt will utilize goal-management and organization strategies (PQRST, Goal-Plan-Do-Review) for reading and complex task completion given rare min A.  Baseline: Pt does not use goal-management strategies. Goal status: INITIAL  3.  Pt will use memory strategies (ex. external memory aids/spaced-retrieval) to optimize short-term memory during ADLs given rare min A.  Baseline: Pt does not use memory strategies.  Goal status: INITIAL  4.  Pt will report improved energy management and communicative  effectiveness in daily tasks/responsibilities according to PROM. Baseline: PROM is currently being completed by pt at home.  Goal status: INITIAL   ASSESSMENT:  CLINICAL IMPRESSION: Patient is a 43 y.o. female who was seen today for evaluation of language production and cognitive-communication skills s/p CVA. Pt presents with mild-moderate cognitive-communication deficits characterized by word-finding difficulties, short-term memory impairment, executive dysfunction, and changes to cognitive processing speeds. Pt notes that her communication is impaired compared to PLOF due to word-finding deficits, increased processing time during conversation, increased processing time during reading, and short-term memory difficulties. 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 Pt also states that she gets more fatigued by the end of the day, which results in increased word-finding difficulties and pt-reported stuttering. 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 Without these services, pt is at risk of not fulfilling her responsibilities and reduced activity engagement due to noted cognitive-communication deficits above.   OBJECTIVE IMPAIRMENTS: include memory, executive functioning, and expressive language. These impairments are limiting patient from return to work, household responsibilities, ADLs/IADLs, and effectively communicating at home and in community. Factors affecting potential to achieve goals and functional outcome are N/A. Patient will benefit from skilled SLP services to address above impairments and improve overall function.  REHAB POTENTIAL: Excellent  PLAN:  SLP FREQUENCY: 2x/week  SLP DURATION: 8 weeks  PLANNED INTERVENTIONS: Cueing hierachy, Cognitive reorganization, Internal/external aids,  Functional tasks, SLP instruction and feedback, Compensatory strategies, Patient/family education, and 07492 Treatment of speech (30 or 45 min)     Waddell Music, CF-SLP 04/08/2024, 1:02 PM

## 2024-04-09 ENCOUNTER — Telehealth: Payer: Self-pay | Admitting: *Deleted

## 2024-04-09 NOTE — Telephone Encounter (Addendum)
 Spoke to patient made her aware CT scan ordered . Informed patient please allow 7-14 business days for insurance approval  . Pt expressed understanding and thanked me for calling

## 2024-04-09 NOTE — Progress Notes (Signed)
 I agree with the above plan

## 2024-04-09 NOTE — Telephone Encounter (Signed)
-----   Message from Duwaine Russell, NP sent at 04/09/2024  1:08 PM EST ----- Please let patient know that I ordered CT scan

## 2024-04-10 NOTE — Telephone Encounter (Signed)
 Janell From [facility] called stated that Pt will need MRI order changed . Janell stated that  Pt would need order placed for    CT SOFT TISSUE NECK W WO CONTRAST  needs to be changed  to CT SOFT TISSUE NECK WO Contrast  due to Pt not have any showing of Cancer or mass , So that will not be able to to do with Contrast.  Also to Change   MRI from CT HEAD W& WO CONTRAST  to either  CT HEAD  WO CONTRAST  or CT ANGIO HEAD  CONTRAST  Callback number [phone]

## 2024-04-14 NOTE — Telephone Encounter (Signed)
 I called and spoke to [facility] CT tech.  They said that evaluate for stroke CTA head w/wo, and CT soft tissue w contrast  for lymphadenopathy.

## 2024-04-14 NOTE — Addendum Note (Signed)
 Addended by: NEYSA NENA RAMAN on: 04/14/2024 02:31 PM   Modules accepted: Orders

## 2024-04-15 ENCOUNTER — Ambulatory Visit

## 2024-04-15 DIAGNOSIS — R41841 Cognitive communication deficit: Secondary | ICD-10-CM | POA: Diagnosis not present

## 2024-04-15 NOTE — Telephone Encounter (Signed)
 Order signed

## 2024-04-15 NOTE — Telephone Encounter (Signed)
 [facility] called back, spoke to Panzie.  I relayed that we need to evaluate the CT head w/wo for definite stroke (repeat) due to pt had MRI and was not conclusive,  the CT soft tissue of neck w contrast is to evaluate the cervical lymph adenopathy (found on CTA done 03-08-2024)

## 2024-04-15 NOTE — Therapy (Signed)
 OUTPATIENT SPEECH LANGUAGE PATHOLOGY TREATMENT   Patient Name: Debra Obrien MRN: [id number] DOB:October 05, 1980, 43 y.o., female Today's Date: 04/15/2024  PCP: [facility] REFERRING PROVIDER: Francesco Elsie NOVAK, MD  END OF SESSION:  End of Session - 04/15/24 1155     Visit Number 2    Number of Visits 17    Date for Recertification  06/03/24    SLP Start Time 1105    SLP Stop Time  1145    SLP Time Calculation (min) 40 min    Activity Tolerance Patient tolerated treatment well           Past Medical History:  Diagnosis Date   Hypertension    Past Surgical History:  Procedure Laterality Date   TRANSESOPHAGEAL ECHOCARDIOGRAM (CATH LAB) N/A 03/11/2024   Procedure: TRANSESOPHAGEAL ECHOCARDIOGRAM;  Surgeon: Sheena Pugh, DO;  Location: [facility];  Service: Cardiovascular;  Laterality: N/A;   Patient Active Problem List   Diagnosis Date Noted   Cerebrovascular accident (CVA) due to thrombosis of right middle cerebral artery (HCC) 03/09/2024   Acute cerebral infarction (HCC) 03/08/2024   History of PID 02/26/2019   Hypertensive emergency 02/26/2019   PID (acute pelvic inflammatory disease) 01/19/2019    ONSET DATE: 03/10/2024 (Referral created)   REFERRING DIAG:  I63.311 (ICD-10-CM) - Cerebrovascular accident (CVA) due to thrombosis of right middle cerebral artery (HCC)    THERAPY DIAG:  Cognitive communication deficit  Rationale for Evaluation and Treatment: Rehabilitation  SUBJECTIVE:   SUBJECTIVE STATEMENT: I have to slow down. Pt accompanied by: self and family member  PERTINENT HISTORY: Pt is a 43 y.o. F who was referred for ST s/p CVA. Pt was hospitalized from 03/08/24 to 03/11/24 for CVA. Pt notes that she is having difficulties with word finding, memory difficulties, and energy management. Pt states that her word-finding is worse in the evenings. Pt gets easily fatigued during the day and has reduced activity tolerance  for ADLs. Pt notes that she feels good about household responsibilities. PMHx includes: HTN, HLD, PID, substance use (Marijuana, cocaine, cigarettes).  PAIN:  Are you having pain? No  FALLS: Has patient fallen in last 6 months?  No  LIVING ENVIRONMENT: Lives with: lives with their family and lives with their son Lives in: House/apartment  PLOF:  Level of assistance: Independent with ADLs Employment: Armed forces training and education officer, Student  PATIENT GOALS: To get back to my normal self.   OBJECTIVE:  Note: Objective measures were completed at Evaluation unless otherwise noted.  DIAGNOSTIC FINDINGS: EXAM: MRI BRAIN WITH CONTRAST 03/09/2024 03:56:23 PM   TECHNIQUE: Multiplanar multisequence MRI of the head/brain was performed with the administration of 10 mL gadobutrol  (GADAVIST ) 1 MMOL/ML injection.   COMPARISON: CTA head and neck and MRI head 03/08/2024.   CLINICAL HISTORY: Stroke suspected (Ped 0-17y).   FINDINGS:   BRAIN AND VENTRICLES: There is mild cortical enhancement along the posterior right temporal lobe and inferior right parietal lobe in the region of signal abnormality noted on the recent MRI findings. This likely reflects enhancement in the setting of subacute infarct. Reiterate recommendation for follow-up MRI in 3 months to ensure resolution of these findings. There is no nodular or masslike enhancement appreciated on the current study. No acute infarct. No acute intracranial hemorrhage. No mass effect or midline shift. No hydrocephalus. The sella is unremarkable. Normal flow voids.   ORBITS: No acute abnormality.   SINUSES: No acute abnormality.   BONES AND SOFT TISSUES: Normal bone marrow signal and enhancement.  No acute soft tissue abnormality.   IMPRESSION: 1. Mild cortical enhancement along the posterior right temporal and inferior right parietal lobes, likely reflecting subacute infarct-related enhancement. 2. No nodular or masslike enhancement. 3.  Follow-up MRI in 3 months is recommended to ensure resolution.  COGNITION: Overall cognitive status: Impaired Areas of impairment:  Memory: Impaired: Short term Executive function: Impaired: Problem solving, Organization, Planning, and Slow processing Functional deficits: Remembering details from conversation.  AUDITORY COMPREHENSION: Overall auditory comprehension: Appears intact YES/NO questions: Appears intact Following directions: Appears intact Conversation: Complex Interfering components: processing speed Effective technique: extra processing time  READING COMPREHENSION: Intact   EXPRESSION: verbal  VERBAL EXPRESSION: Level of generative/spontaneous verbalization: conversation Automatic speech: name: intact, social response: intact, and counting: intact  Repetition: Appears intact Naming: Confrontation: 76-100% and Divergent: 76-100% Pragmatics: Appears intact Comments: N/A Interfering components: N/A Effective technique: Will be explored in upcoming sessions. Non-verbal means of communication: N/A  WRITTEN EXPRESSION: Deferred to upcoming session. Dominant hand: N/A Written expression: N/A  MOTOR SPEECH: Overall motor speech: Appears intact   STANDARDIZED ASSESSMENTS: QAB: No aphasia and SLUMS: 22/30 (Moderate)  PATIENT REPORTED OUTCOME MEASURES (PROM): PROM was sent home with pt for completion by next session.                                                                                                                            TREATMENT DATE:   04/15/24: Pt completed PROM, Mental Fatigue Scale for assessing changes to energy management and thinking skills s/p CVA. Pt scored a 10. Pt's worst scores were in the areas of slowness of thinking,   SLP introduced Semantic Feature Analysis (SFA) as a strategy for optimizing word-finding. SLP guided pt through SFA and required pt to complete SFA for a specific object (spoon). Pt completed SFA with 100%  consistency for providing 6+ descriptions for 1 object given frequent verbal min A. Pt completed a brief writing assessment for SLP analysis of writing skills and conventions used by pt. Pt's background as a student warrants a brief writing assessment. Pt's writing sample demonstrated overall adequate spelling and syntax; however, pt reported that it takes her more time to process and think of what to write. SLP began education about using external cognitive supports for writing retail banker) to improving organization and planning for writing to reduce cognitive load for energy management. SLP introduced the spoons theory for optimizing energy management for cognitive tasks and ADLs. SLP collaborate with pt to determine tasks and their mental energy requirements. SLP challenged pt to generate a list of tasks and their energy requirements for upcoming session. In addition, pt was given objects to practice SFA for maximizing word-finding for communication skills.   04/08/24: EVALUATION COMPLETED. No tx was completed on this date. Plan is to complete PROMs in upcoming session and to assess writing for academic needs.    PATIENT EDUCATION: Education details: Assessment results. Person educated: Patient Education method: Explanation Education comprehension: verbalized understanding  GOALS: Goals reviewed with patient? Yes  SHORT TERM GOALS: Target date: 05/06/2024  Pt will complete PROM related to communication effectiveness and mental fatigue.  Baseline: Pt took home PROM to complete.  Goal status: ONGOING  2.  Pt will perform Semantic Feature Analysis (SFA) with 90% consistency to optimize descriptions for word-finding given occasional to rare min A. Baseline: N/A Goal status: ONGOING  3.  Pt will utilize spaced-retrieval to learn 5+ new facts about relevant topics to the pt given occasional min A.  Baseline: Impaired short-term memory. Goal status: ONGOING  4.  Pt will  implement at least 1 external memory strategies at home for daily tasks.  Baseline: Pt uses no memory strategies.  Goal status: ONGOING  5.  Pt will read a complex text using PQRST with 90% consistency to maximize thought organization and comprehension given occasional min A. Baseline: Pt does not use PQRST as a reading strategy.  Goal status: ONGOING  6.  Pt will integrate energy management strategies for optimizing cognitive energy for ADLs in 80% of opportunities given occasional min A. Baseline: Pt does not use energy management strategies. Goal status: ONGOING  LONG TERM GOALS: Target date: 06/03/2024  The patient will participate in complex conversation, using circumlocution and other word-finding strategies for minimizing communication breakdown given rare min A.  Baseline: Pt noted word-finding difficulties, especially when tired.  Goal status: ONGOING  2.  Pt will utilize goal-management and organization strategies (PQRST, Goal-Plan-Do-Review) for reading and complex task completion given rare min A.  Baseline: Pt does not use goal-management strategies. Goal status: ONGOING  3.  Pt will use memory strategies (ex. external memory aids/spaced-retrieval) to optimize short-term memory during ADLs given rare min A.  Baseline: Pt does not use memory strategies.  Goal status: ONGOING  4.  Pt will report improved energy management and communicative effectiveness in daily tasks/responsibilities according to PROM. Baseline: PROM is currently being completed by pt at home.  Goal status: ONGOING   ASSESSMENT:  CLINICAL IMPRESSION: Patient is a 43 y.o. female who was seen today for evaluation of language production and cognitive-communication skills s/p CVA. Pt presents with mild-moderate cognitive-communication deficits characterized by word-finding difficulties, short-term memory impairment, executive dysfunction, and changes to cognitive processing speeds. Pt notes that her  communication is impaired compared to PLOF due to word-finding deficits, increased processing time during conversation, increased processing time during reading, and short-term memory difficulties. 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 Pt also states that she gets more fatigued by the end of the day, which results in increased word-finding difficulties and pt-reported stuttering. 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 Without these services, pt is at risk of not fulfilling her responsibilities and reduced activity engagement due to noted cognitive-communication deficits above.   OBJECTIVE IMPAIRMENTS: include memory, executive functioning, and expressive language. These impairments are limiting patient from return to work, household responsibilities, ADLs/IADLs, and effectively communicating at home and in community. Factors affecting potential to achieve goals and functional outcome are N/A. Patient will benefit from skilled SLP services to address above impairments and improve overall function.  REHAB POTENTIAL: Excellent  PLAN:  SLP FREQUENCY: 2x/week  SLP DURATION: 8 weeks  PLANNED INTERVENTIONS: Cueing hierachy, Cognitive reorganization, Internal/external aids, Functional tasks, SLP instruction and feedback, Compensatory strategies, Patient/family education, and 07492 Treatment of speech (30 or  45 min)     Waddell Music, CF-SLP 04/15/2024, 11:56 AM

## 2024-04-15 NOTE — Addendum Note (Signed)
 Addended by: SHERRYL DUWAINE SQUIBB on: 04/15/2024 08:29 AM   Modules accepted: Orders

## 2024-04-15 NOTE — Addendum Note (Signed)
 Addended by: NEYSA NENA RAMAN on: 04/15/2024 10:59 AM   Modules accepted: Orders

## 2024-04-17 ENCOUNTER — Ambulatory Visit

## 2024-04-17 DIAGNOSIS — R41841 Cognitive communication deficit: Secondary | ICD-10-CM

## 2024-04-17 NOTE — Patient Instructions (Signed)
 Energy management -   1 spoon - Basic self-care, cooking (1-2 spoons) depending on what you cook, straightening up the trash,  2 spoons - More elaborate self-care, Deep cleaning without moving furniture, walking across the parking lot, walking around the grocery store,   3 spoons -  6 spoons - Being a mom.

## 2024-04-17 NOTE — Therapy (Signed)
 Patient Name: Debra Obrien MRN: [id number] DOB:Mar 25, 1981, 43 y.o., female Today's Date: 04/17/2024  Pt was present for session; however, pt received a call from family member about a serious event and had to leave after 2 minutes into session. Thus, no tx conducted on this date.   Waddell Music, CF-SLP 04/17/2024, 8:09 AM

## 2024-04-20 ENCOUNTER — Ambulatory Visit: Admitting: Speech Pathology

## 2024-04-20 NOTE — Telephone Encounter (Signed)
 12/22 healthy blue shara: [phone] exp. 04/20/24-07/18/24

## 2024-04-20 NOTE — Telephone Encounter (Signed)
 Consuelo from [facility] called to follow up about PA for Pt MRI . Consuelo stated that they will need PA approved before  tomorrow 04-21-24 @ 10 :00 am   Callback number is  [phone]

## 2024-04-22 ENCOUNTER — Ambulatory Visit

## 2024-04-24 ENCOUNTER — Ambulatory Visit
Admission: RE | Admit: 2024-04-24 | Discharge: 2024-04-24 | Disposition: A | Discharge status: Home / Self Care | Source: Ambulatory Visit | Attending: Adult Health | Admitting: Adult Health

## 2024-04-24 DIAGNOSIS — I639 Cerebral infarction, unspecified: Secondary | ICD-10-CM

## 2024-04-24 DIAGNOSIS — R590 Localized enlarged lymph nodes: Secondary | ICD-10-CM

## 2024-04-24 MED ORDER — IOPAMIDOL (ISOVUE-300) INJECTION 61%
100.0000 mL | Freq: Once | INTRAVENOUS | Status: AC | PRN
  Administered 2024-04-24: 100 mL via INTRAVENOUS

## 2024-04-27 ENCOUNTER — Ambulatory Visit

## 2024-04-27 ENCOUNTER — Ambulatory Visit: Payer: Self-pay | Admitting: *Deleted

## 2024-04-27 ENCOUNTER — Telehealth: Payer: Self-pay

## 2024-04-27 NOTE — Telephone Encounter (Signed)
 LMVM for pt to call back regarding CT results.   IMPRESSION: This CT scan of the brain with and without contrast shows the following: 1. 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 2. Normal enhancement pattern.  No new acute findings.  This study was otherwise normal.

## 2024-04-27 NOTE — Telephone Encounter (Signed)
 Patient Name: Debra Obrien MRN: [id number] DOB:10/15/1980, 43 y.o., female Today's Date: 04/27/2024  SLP attempted to call pt about missed appointment on 04/27/2024 and to give reminder about her upcoming appointment on 04/29/24 at 11 am. However, phone was not answered with no option to voicemail.   Waddell Music, CF-SLP 04/27/2024, 4:25 PM

## 2024-04-29 ENCOUNTER — Ambulatory Visit

## 2024-05-04 ENCOUNTER — Ambulatory Visit

## 2024-05-06 ENCOUNTER — Ambulatory Visit

## 2024-05-06 NOTE — Telephone Encounter (Signed)
 Spoke to patient gave CT results Pt expressed understanding and thanked me for calling

## 2024-05-06 NOTE — Telephone Encounter (Signed)
-----   Message from Duwaine Russell, NP sent at 05/04/2024  4:58 PM EST ----- No acute finding. Can you make patient aware.

## 2024-05-11 ENCOUNTER — Ambulatory Visit

## 2024-05-13 ENCOUNTER — Ambulatory Visit: Payer: Self-pay | Admitting: Adult Health

## 2024-05-13 ENCOUNTER — Ambulatory Visit

## 2024-05-13 NOTE — Telephone Encounter (Signed)
 Spoke to patient gave CTA results Gave Megan,NP recommendations. Pt states will f/u with PCP. Sent CTA results to PCP this am Pt thanked me for calling

## 2024-05-13 NOTE — Telephone Encounter (Signed)
-----   Message from Duwaine Russell, NP sent at 05/13/2024  9:49 AM EST ----- Please call patient and let her know that CTA showed that the lymph nodes are stable and have decreased in size.  They note that it has a benign appearance.  Please also send to PCP to see if they  want to do any additional follow-up.  Tell patient she can also follow-up with PCP regarding this finding.

## 2024-05-18 ENCOUNTER — Ambulatory Visit

## 2024-05-20 ENCOUNTER — Ambulatory Visit

## 2024-05-27 ENCOUNTER — Ambulatory Visit

## 2024-06-01 ENCOUNTER — Ambulatory Visit

## 2024-06-03 ENCOUNTER — Ambulatory Visit

## 2025-02-01 ENCOUNTER — Ambulatory Visit: Admitting: Adult Health
